# Patient Record
Sex: Female | Born: 1975
Health system: Southern US, Community
[De-identification: ages and names within clinical notes are randomized; demographics above are authoritative.]

## PROBLEM LIST (undated history)

## (undated) DIAGNOSIS — G4700 Insomnia, unspecified: Secondary | ICD-10-CM

## (undated) DIAGNOSIS — K219 Gastro-esophageal reflux disease without esophagitis: Secondary | ICD-10-CM

## (undated) DIAGNOSIS — Z9289 Personal history of other medical treatment: Secondary | ICD-10-CM

## (undated) DIAGNOSIS — E538 Deficiency of other specified B group vitamins: Secondary | ICD-10-CM

## (undated) DIAGNOSIS — J45909 Unspecified asthma, uncomplicated: Secondary | ICD-10-CM

## (undated) DIAGNOSIS — R937 Abnormal findings on diagnostic imaging of other parts of musculoskeletal system: Secondary | ICD-10-CM

## (undated) DIAGNOSIS — R319 Hematuria, unspecified: Secondary | ICD-10-CM

## (undated) DIAGNOSIS — F419 Anxiety disorder, unspecified: Secondary | ICD-10-CM

## (undated) DIAGNOSIS — G5600 Carpal tunnel syndrome, unspecified upper limb: Secondary | ICD-10-CM

## (undated) DIAGNOSIS — R0683 Snoring: Secondary | ICD-10-CM

## (undated) DIAGNOSIS — R928 Other abnormal and inconclusive findings on diagnostic imaging of breast: Secondary | ICD-10-CM

## (undated) DIAGNOSIS — T4145XA Adverse effect of unspecified anesthetic, initial encounter: Secondary | ICD-10-CM

## (undated) DIAGNOSIS — G4730 Sleep apnea, unspecified: Secondary | ICD-10-CM

## (undated) DIAGNOSIS — F410 Panic disorder [episodic paroxysmal anxiety] without agoraphobia: Secondary | ICD-10-CM

## (undated) DIAGNOSIS — G709 Myoneural disorder, unspecified: Secondary | ICD-10-CM

## (undated) DIAGNOSIS — R42 Dizziness and giddiness: Secondary | ICD-10-CM

## (undated) DIAGNOSIS — M797 Fibromyalgia: Secondary | ICD-10-CM

## (undated) DIAGNOSIS — E079 Disorder of thyroid, unspecified: Secondary | ICD-10-CM

## (undated) DIAGNOSIS — E559 Vitamin D deficiency, unspecified: Secondary | ICD-10-CM

## (undated) DIAGNOSIS — R569 Unspecified convulsions: Secondary | ICD-10-CM

## (undated) DIAGNOSIS — T8859XA Other complications of anesthesia, initial encounter: Secondary | ICD-10-CM

## (undated) HISTORY — DX: Unspecified convulsions: R56.9

## (undated) HISTORY — DX: Dizziness and giddiness: R42

## (undated) HISTORY — PX: APPENDECTOMY: SHX54

## (undated) HISTORY — DX: Snoring: R06.83

## (undated) HISTORY — DX: Hematuria, unspecified: R31.9

## (undated) HISTORY — DX: Deficiency of other specified B group vitamins: E53.8

## (undated) HISTORY — DX: Gastro-esophageal reflux disease without esophagitis: K21.9

## (undated) HISTORY — DX: Other abnormal and inconclusive findings on diagnostic imaging of breast: R92.8

## (undated) HISTORY — DX: Vitamin D deficiency, unspecified: E55.9

## (undated) HISTORY — DX: Personal history of other medical treatment: Z92.89

## (undated) HISTORY — DX: Hemochromatosis, unspecified: E83.119

## (undated) HISTORY — DX: Abnormal findings on diagnostic imaging of other parts of musculoskeletal system: R93.7

## (undated) HISTORY — DX: Insomnia, unspecified: G47.00

## (undated) HISTORY — DX: Unspecified asthma, uncomplicated: J45.909

## (undated) HISTORY — PX: HERNIA REPAIR: SHX51

## (undated) HISTORY — DX: Myoneural disorder, unspecified: G70.9

## (undated) HISTORY — DX: Fibromyalgia: M79.7

## (undated) HISTORY — DX: Disorder of thyroid, unspecified: E07.9

## (undated) HISTORY — DX: Carpal tunnel syndrome, unspecified upper limb: G56.00

---

## 2005-09-30 HISTORY — PX: DILATION AND CURETTAGE OF UTERUS: SHX78

## 2005-10-02 ENCOUNTER — Emergency Department (HOSPITAL_COMMUNITY): Admission: EM | Admit: 2005-10-02 | Discharge: 2005-10-02 | Payer: Self-pay | Admitting: Emergency Medicine

## 2005-12-09 ENCOUNTER — Emergency Department (HOSPITAL_COMMUNITY): Admission: EM | Admit: 2005-12-09 | Discharge: 2005-12-09 | Payer: Self-pay | Admitting: *Deleted

## 2008-03-06 ENCOUNTER — Emergency Department (HOSPITAL_BASED_OUTPATIENT_CLINIC_OR_DEPARTMENT_OTHER): Admission: EM | Admit: 2008-03-06 | Discharge: 2008-03-06 | Payer: Self-pay | Admitting: Emergency Medicine

## 2008-11-05 ENCOUNTER — Emergency Department (HOSPITAL_BASED_OUTPATIENT_CLINIC_OR_DEPARTMENT_OTHER): Admission: EM | Admit: 2008-11-05 | Discharge: 2008-11-05 | Payer: Self-pay | Admitting: Emergency Medicine

## 2008-11-16 ENCOUNTER — Encounter: Admission: RE | Admit: 2008-11-16 | Discharge: 2008-11-16 | Payer: Self-pay | Admitting: Family Medicine

## 2009-06-30 ENCOUNTER — Ambulatory Visit: Payer: Self-pay | Admitting: Hematology & Oncology

## 2009-07-20 LAB — CBC WITH DIFFERENTIAL (CANCER CENTER ONLY)
BASO#: 0 10*3/uL (ref 0.0–0.2)
BASO%: 0.4 % (ref 0.0–2.0)
EOS%: 1.3 % (ref 0.0–7.0)
HGB: 12.5 g/dL (ref 11.6–15.9)
LYMPH%: 35.2 % (ref 14.0–48.0)
MCH: 30 pg (ref 26.0–34.0)
MCV: 87 fL (ref 81–101)
MONO#: 0.3 10*3/uL (ref 0.1–0.9)
MONO%: 5.9 % (ref 0.0–13.0)
NEUT%: 57.2 % (ref 39.6–80.0)

## 2009-07-20 LAB — CHCC SATELLITE - SMEAR

## 2009-07-21 LAB — RETICULOCYTES (CHCC)
RBC.: 4.24 MIL/uL (ref 3.87–5.11)
Retic Ct Pct: 0.6 % (ref 0.4–3.1)

## 2009-07-21 LAB — LACTATE DEHYDROGENASE: LDH: 113 U/L (ref 94–250)

## 2009-07-21 LAB — ERYTHROPOIETIN: Erythropoietin: 12 m[IU]/mL (ref 2.6–34.0)

## 2009-07-24 LAB — INTRINSIC FACTOR ANTIBODIES: Intrinsic Factor: NEGATIVE

## 2009-07-31 ENCOUNTER — Ambulatory Visit: Payer: Self-pay | Admitting: Hematology & Oncology

## 2009-09-15 ENCOUNTER — Encounter: Admission: RE | Admit: 2009-09-15 | Discharge: 2009-09-15 | Payer: Self-pay | Admitting: Gastroenterology

## 2009-09-15 ENCOUNTER — Ambulatory Visit: Payer: Self-pay | Admitting: Hematology & Oncology

## 2009-09-18 LAB — CBC WITH DIFFERENTIAL (CANCER CENTER ONLY)
BASO#: 0 10*3/uL (ref 0.0–0.2)
HCT: 35 % (ref 34.8–46.6)
LYMPH#: 1.7 10*3/uL (ref 0.9–3.3)
MCHC: 35.1 g/dL (ref 32.0–36.0)
MCV: 86 fL (ref 81–101)
MONO%: 5.6 % (ref 0.0–13.0)
NEUT#: 3.1 10*3/uL (ref 1.5–6.5)
Platelets: 191 10*3/uL (ref 145–400)
RBC: 4.09 10*6/uL (ref 3.70–5.32)
RDW: 12.2 % (ref 10.5–14.6)

## 2009-09-18 LAB — RETICULOCYTES (CHCC)
ABS Retic: 45.4 10*3/uL (ref 19.0–186.0)
Retic Ct Pct: 1.1 % (ref 0.4–3.1)

## 2010-01-08 ENCOUNTER — Ambulatory Visit: Payer: Self-pay | Admitting: Interventional Radiology

## 2010-01-08 ENCOUNTER — Ambulatory Visit (HOSPITAL_BASED_OUTPATIENT_CLINIC_OR_DEPARTMENT_OTHER): Admission: RE | Admit: 2010-01-08 | Discharge: 2010-01-08 | Payer: Self-pay | Admitting: Family Medicine

## 2011-01-15 LAB — BASIC METABOLIC PANEL
Calcium: 9.2 mg/dL (ref 8.4–10.5)
Chloride: 103 mEq/L (ref 96–112)
Creatinine, Ser: 0.8 mg/dL (ref 0.4–1.2)
GFR calc Af Amer: >60 mL/min (ref >60)
Glucose, Bld: 103 mg/dL — ABNORMAL HIGH (ref 70–99)
Potassium: 3.9 mEq/L (ref 3.5–5.1)

## 2011-01-15 LAB — URINALYSIS, ROUTINE W REFLEX MICROSCOPIC
Glucose, UA: NEGATIVE mg/dL
Ketones, ur: NEGATIVE mg/dL
Leukocytes, UA: NEGATIVE
Protein, ur: NEGATIVE mg/dL
pH: 7 (ref 5.0–8.0)

## 2011-11-06 ENCOUNTER — Emergency Department (HOSPITAL_BASED_OUTPATIENT_CLINIC_OR_DEPARTMENT_OTHER)
Admission: EM | Admit: 2011-11-06 | Discharge: 2011-11-06 | Disposition: A | Discharge status: Home / Self Care | Payer: Managed Care, Other (non HMO) | Attending: Emergency Medicine | Admitting: Emergency Medicine

## 2011-11-06 ENCOUNTER — Encounter (HOSPITAL_BASED_OUTPATIENT_CLINIC_OR_DEPARTMENT_OTHER): Payer: Self-pay | Admitting: *Deleted

## 2011-11-06 DIAGNOSIS — F32A Depression, unspecified: Secondary | ICD-10-CM

## 2011-11-06 DIAGNOSIS — R110 Nausea: Secondary | ICD-10-CM

## 2011-11-06 DIAGNOSIS — F329 Major depressive disorder, single episode, unspecified: Secondary | ICD-10-CM | POA: Insufficient documentation

## 2011-11-06 DIAGNOSIS — F3289 Other specified depressive episodes: Secondary | ICD-10-CM | POA: Insufficient documentation

## 2011-11-06 HISTORY — DX: Panic disorder (episodic paroxysmal anxiety): F41.0

## 2011-11-06 HISTORY — DX: Anxiety disorder, unspecified: F41.9

## 2011-11-06 LAB — URINALYSIS, ROUTINE W REFLEX MICROSCOPIC
Bilirubin Urine: NEGATIVE
Glucose, UA: NEGATIVE mg/dL
Ketones, ur: NEGATIVE mg/dL
Leukocytes, UA: NEGATIVE
Nitrite: NEGATIVE
Protein, ur: NEGATIVE mg/dL
Specific Gravity, Urine: 1.004 — ABNORMAL LOW (ref 1.005–1.030)
Urobilinogen, UA: 0.2 mg/dL (ref 0.0–1.0)
pH: 6 (ref 5.0–8.0)

## 2011-11-06 LAB — URINE MICROSCOPIC-ADD ON

## 2011-11-06 LAB — PREGNANCY, URINE: Preg Test, Ur: NEGATIVE

## 2011-11-06 MED ORDER — SODIUM CHLORIDE 0.9 % IV BOLUS (SEPSIS)
1000.0000 mL | Freq: Once | INTRAVENOUS | Status: AC
  Administered 2011-11-06: 1000 mL via INTRAVENOUS

## 2011-11-06 MED ORDER — ONDANSETRON HCL 4 MG/2ML IJ SOLN
4.0000 mg | Freq: Once | INTRAMUSCULAR | Status: AC
  Administered 2011-11-06: 4 mg via INTRAVENOUS
  Filled 2011-11-06: qty 2

## 2011-11-06 MED ORDER — ONDANSETRON 8 MG PO TBDP: 8.0000 mg | ORAL_TABLET | Freq: Three times a day (TID) | ORAL | Status: AC | PRN

## 2011-11-06 MED ORDER — LORAZEPAM 1 MG PO TABS: 1.0000 mg | ORAL_TABLET | Freq: Every evening | ORAL | Status: AC | PRN

## 2011-11-06 MED ORDER — LORAZEPAM 2 MG/ML IJ SOLN
1.0000 mg | Freq: Once | INTRAMUSCULAR | Status: AC
  Administered 2011-11-06: 1 mg via INTRAVENOUS
  Filled 2011-11-06: qty 1

## 2011-11-06 NOTE — ED Provider Notes (Signed)
History     CSN: [id number]  Arrival date & time 11/06/11  0220   First MD Initiated Contact with Patient 11/06/11 0236      Chief Complaint  Patient presents with  . Nausea    (Consider location/radiation/quality/duration/timing/severity/associated sxs/prior treatment) HPI This is a 36 year old white female who had been taking care of her mother-in-law at her house. Her mother-in-law recently died. She has also become aware that her husband was unfaithful to her. She has become very depressed over the past several weeks. She has had difficulty sleeping and states she has not slept in several nights. She was prescribed amitriptyline 10 mg at bedtime but this has not helped. She has a therapist who has been treating her depression. She is here specifically for treatment of her insomnia and nausea. Because of her nausea she has not been eating or drinking for the past several days and feels like she is dehydrated. She feels very tired, which was exacerbated by an asthma attack and anxiety and hyperventilation earlier this morning. She states she feels very sad and weak but denies suicidal or homicidal ideation and does not wish any behavioral health intervention at this time. Symptoms are described as moderate to severe.  Past Medical History  Diagnosis Date  . Anxiety   . Panic attacks     History reviewed. No pertinent past surgical history.  History reviewed. No pertinent family history.  History  Substance Use Topics  . Smoking status: Smoker, Current Status Unknown  . Smokeless tobacco: Not on file  . Alcohol Use: Yes    OB History    Grav Para Term Preterm Abortions TAB SAB Ect Mult Living                  Review of Systems  All other systems reviewed and are negative.    Allergies  Review of patient's allergies indicates no known allergies.  Home Medications   Current Outpatient Rx  Name Route Sig Dispense Refill  . AMITRIPTYLINE HCL 10 MG PO TABS Oral Take 10  mg by mouth at bedtime.      BP 128/84  Pulse 95  Temp(Src) 98.2 F (36.8 C) (Oral)  Resp 18  Ht 5\' 5"  (1.651 m)  Wt 120 lb (54.432 kg)  BMI 19.97 kg/m2  SpO2 100%  Physical Exam General: Well-developed, well-nourished female in no acute distress; appearance consistent with age of record HENT: normocephalic, atraumatic Eyes: pupils equal round and reactive to light; extraocular muscles intact Neck: supple Heart: regular rate and rhythm Lungs: clear to auscultation bilaterally Abdomen: soft; nondistended; nontender; bowel sounds present Extremities: No deformity; full range of motion Neurologic: Awake, alert and oriented; motor function intact in all extremities and symmetric; no facial droop Skin: Warm and dry Psychiatric: Depressed with congruent affect    ED Course  Procedures (including critical care time)     MDM   Nursing notes and vitals signs, including pulse oximetry, reviewed.  Summary of this visit's results, reviewed by myself:  Labs:  Results for orders placed during the hospital encounter of 11/06/11  URINALYSIS, ROUTINE W REFLEX MICROSCOPIC      Component Value Range   Color, Urine YELLOW  YELLOW    APPearance CLEAR  CLEAR    Specific Gravity, Urine 1.004 (*) 1.005 - 1.030    pH 6.0  5.0 - 8.0    Glucose, UA NEGATIVE  NEGATIVE (mg/dL)   Hgb urine dipstick LARGE (*) NEGATIVE    Bilirubin Urine NEGATIVE  NEGATIVE    Ketones, ur NEGATIVE  NEGATIVE (mg/dL)   Protein, ur NEGATIVE  NEGATIVE (mg/dL)   Urobilinogen, UA 0.2  0.0 - 1.0 (mg/dL)   Nitrite NEGATIVE  NEGATIVE    Leukocytes, UA NEGATIVE  NEGATIVE   PREGNANCY, URINE      Component Value Range   Preg Test, Ur NEGATIVE  NEGATIVE   URINE MICROSCOPIC-ADD ON      Component Value Range   Squamous Epithelial / LPF RARE  RARE    WBC, UA 0-2  <3 (WBC/hpf)   RBC / HPF 0-2  <3 (RBC/hpf)   Bacteria, UA RARE  RARE             Hanley Seamen, MD 11/06/11 [phone]

## 2011-11-06 NOTE — ED Notes (Signed)
C/o being sad, loss of appetite, and nausea for few days.

## 2011-11-06 NOTE — ED Notes (Signed)
Pt resting with eyes closed, resps even and unlabored, NAD noted. Family remains at bs.

## 2011-11-06 NOTE — ED Notes (Signed)
Pt states that she has been dealing with recent infidelity of her husband and the loss of a loved one. Pt denies SI or HI, just c/o inability to sleep or rest for days.

## 2011-11-06 NOTE — ED Notes (Signed)
MD at bedside. 

## 2012-09-30 DIAGNOSIS — R928 Other abnormal and inconclusive findings on diagnostic imaging of breast: Secondary | ICD-10-CM

## 2012-09-30 HISTORY — PX: KNEE ARTHROSCOPY: SUR90

## 2012-09-30 HISTORY — PX: CHOLECYSTECTOMY: SHX55

## 2012-09-30 HISTORY — DX: Other abnormal and inconclusive findings on diagnostic imaging of breast: R92.8

## 2015-01-09 ENCOUNTER — Ambulatory Visit: Payer: Managed Care, Other (non HMO) | Admitting: Nurse Practitioner

## 2015-05-31 ENCOUNTER — Ambulatory Visit: Payer: Managed Care, Other (non HMO) | Admitting: Family Medicine

## 2015-06-01 ENCOUNTER — Ambulatory Visit (INDEPENDENT_AMBULATORY_CARE_PROVIDER_SITE_OTHER): Payer: Managed Care, Other (non HMO) | Admitting: Family Medicine

## 2015-06-01 ENCOUNTER — Encounter: Payer: Self-pay | Admitting: Family Medicine

## 2015-06-01 VITALS — BP 122/86 | HR 79 | Temp 98.6°F | Resp 18 | Ht 65.0 in | Wt 148.8 lb

## 2015-06-01 DIAGNOSIS — Z23 Encounter for immunization: Secondary | ICD-10-CM | POA: Diagnosis not present

## 2015-06-01 DIAGNOSIS — J4520 Mild intermittent asthma, uncomplicated: Secondary | ICD-10-CM | POA: Diagnosis not present

## 2015-06-01 DIAGNOSIS — Z7689 Persons encountering health services in other specified circumstances: Secondary | ICD-10-CM | POA: Insufficient documentation

## 2015-06-01 DIAGNOSIS — F411 Generalized anxiety disorder: Secondary | ICD-10-CM

## 2015-06-01 DIAGNOSIS — E042 Nontoxic multinodular goiter: Secondary | ICD-10-CM | POA: Diagnosis not present

## 2015-06-01 DIAGNOSIS — Z0000 Encounter for general adult medical examination without abnormal findings: Secondary | ICD-10-CM

## 2015-06-01 LAB — CBC WITH DIFFERENTIAL/PLATELET
BASOS ABS: 0 10*3/uL (ref 0.0–0.1)
Basophils Relative: 0.5 % (ref 0.0–3.0)
EOS PCT: 0.7 % (ref 0.0–5.0)
Eosinophils Absolute: 0 10*3/uL (ref 0.0–0.7)
HEMATOCRIT: 40.9 % (ref 36.0–46.0)
Hemoglobin: 14 g/dL (ref 12.0–15.0)
LYMPHS ABS: 2.1 10*3/uL (ref 0.7–4.0)
LYMPHS PCT: 30.7 % (ref 12.0–46.0)
MCHC: 34.3 g/dL (ref 30.0–36.0)
MCV: 87.9 fl (ref 78.0–100.0)
MONOS PCT: 4.9 % (ref 3.0–12.0)
Monocytes Absolute: 0.3 10*3/uL (ref 0.1–1.0)
NEUTROS ABS: 4.3 10*3/uL (ref 1.4–7.7)
Neutrophils Relative %: 63.2 % (ref 43.0–77.0)
PLATELETS: 287 10*3/uL (ref 150.0–400.0)
RBC: 4.65 Mil/uL (ref 3.87–5.11)
RDW: 12.9 % (ref 11.5–15.5)
WBC: 6.9 10*3/uL (ref 4.0–10.5)

## 2015-06-01 LAB — COMPLETE METABOLIC PANEL WITH GFR
ALT: 14 U/L (ref 6–29)
AST: 18 U/L (ref 10–30)
Albumin: 4.3 g/dL (ref 3.6–5.1)
Alkaline Phosphatase: 62 U/L (ref 33–115)
BILIRUBIN TOTAL: 0.4 mg/dL (ref 0.2–1.2)
BUN: 11 mg/dL (ref 7–25)
CALCIUM: 9 mg/dL (ref 8.6–10.2)
CHLORIDE: 104 mmol/L (ref 98–110)
CO2: 24 mmol/L (ref 20–31)
CREATININE: 0.86 mg/dL (ref 0.50–1.10)
GFR, EST NON AFRICAN AMERICAN: 86 mL/min (ref >=60)
Glucose, Bld: 100 mg/dL — ABNORMAL HIGH (ref 65–99)
Potassium: 4.3 mmol/L (ref 3.5–5.3)
Sodium: 136 mmol/L (ref 135–146)
TOTAL PROTEIN: 7.1 g/dL (ref 6.1–8.1)

## 2015-06-01 LAB — IBC PANEL
IRON: 125 ug/dL (ref 42–145)
SATURATION RATIOS: 28.3 % (ref 20.0–50.0)
TRANSFERRIN: 315 mg/dL (ref 212.0–360.0)

## 2015-06-01 LAB — FERRITIN: Ferritin: 43.1 ng/mL (ref 10.0–291.0)

## 2015-06-01 LAB — TSH: TSH: 0.89 u[IU]/mL (ref 0.35–4.50)

## 2015-06-01 MED ORDER — ALBUTEROL SULFATE (2.5 MG/3ML) 0.083% IN NEBU: 2.5000 mg | INHALATION_SOLUTION | Freq: Four times a day (QID) | RESPIRATORY_TRACT | Status: DC | PRN

## 2015-06-01 MED ORDER — ESCITALOPRAM OXALATE 10 MG PO TABS: 10.0000 mg | ORAL_TABLET | Freq: Every day | ORAL | Status: DC

## 2015-06-01 MED ORDER — LORAZEPAM 0.5 MG PO TABS: 0.5000 mg | ORAL_TABLET | Freq: Every day | ORAL | Status: DC

## 2015-06-01 NOTE — Progress Notes (Signed)
Subjective:    Patient ID: Erika Gardner, female    DOB: [birth year], 39 y.o.   MRN: [id number]  HPI  Seizures: Patient reports a history of seizures x2 with syncopal episodes. She is being evaluated by neurology, Dr. Lorain Childes. He is with [facility] per patient. He states her last seizure was June 2016, and she did not seek treatment because she was leaving for vacation. She reports neurology has CT scan to her head, and is ordering an MRI.   Thyroid nodules: Patient reports a history of thyroid nodules, in which she states she has multiple nodules. She reports an ultrasound was completed "many years ago "after she had noted symptoms of pain and discomfort in her anterior neck, near her thyroid. At that time she was found to have nodules per patient's report. Per EMR, patient had a soft tissue/head and neck ultrasound completed in April 2011 which resulted with a small focal nodule (<1cm) in the superior aspect of the right lobe. No other nodules were identified. He was recommended to be followed by ultrasound. Her last TSH was October 2015 and was normal.  Asthma: Patient reports a history of asthma as a young adult. She rarely has needed an albuterol inhaler, but does need intermittently especially when having an upper respiratory infection. She currently does not have a rescue inhaler at home.  Anxiety: Patient reports a history of generalized anxiety disorder. She states she is in counseling at [facility]. She is currently taking  Xanax, lexapro and amitriptyline. She states she does not desire to be on medications, but they try to stop her amitriptyline and as when she started having seizures. She does not desire to be on Xanax, cut she knows that can be addictive.  Hemochromatosis: Patient reports a history hemochromatosis. She reports she hasn't had any iron studies in some time, and she is overdue to have her blood checked. Patient does not have diabetes, denies any skin  pigment changes, increased fatigue/lethargy. Patient also has a history of fibromyalgia. Review of old records, it appears her last labs were October 2015. TIBC 372, U IBC 264, iron 108, iron saturation 29, ferritin 92, within normal limits.  Health maintenance:  Colonoscopy:No fhx, screening 51 Mammogram: 2014 for tenderness, no fhx.  Cervical cancer screening: Due this past August, no abnormal, as OB/GYN Immunizations: need flu and tdap Infectious disease screening: Unknown HIV screening, requesting records  Last labs by requested records 07/18/2014:  Vitamin D, 38.8 CMP:  Sodium 142, potassium 4.1, chloride 104, glucose 89, BUN 9, creatinine 0.8, calcium 9.4, AST 24, ALT 30, alkaline phosphate 81, bilirubin 0.2, total protein 6.9, albumen 3.9. TSH: 1.371, total T4 10.9, thyroid peroxidase antibodies 10 B 12: 713 Folate 16.5 CBC: WBC 8.3, RBC 4.31, hemoglobin 12.6, hematocrit 38.3, MCV 89, MCH 29.2 MCHC 32.9, RDW 12.3, platelet 256.   Past Medical History  Diagnosis Date  . Anxiety   . Panic attacks   . B12 deficiency   . Asthma   . GERD (gastroesophageal reflux disease)   . Neuromuscular disorder   . Thyroid disease   . Seizures     x2 with a syncopal episode (03/2015- last episode)  . Fibromyalgia   . Vertigo    No Known Allergies Past Surgical History  Procedure Laterality Date  . Cholecystectomy    . Dilation and curettage of uterus  2007    Post miscarriage  . Knee arthroscopy Right 2014    wear and tear  . Hernia repair  umbilicus   Family History  Problem Relation Age of Onset  . Arthritis Maternal Grandmother   . Hearing loss Maternal Grandmother   . Cancer Maternal Grandfather     skin  . Heart disease Maternal Grandfather   . Arthritis Paternal Grandmother   . Cancer Paternal Grandfather     kidney  . Heart disease Paternal Grandfather   . Kidney disease Paternal Grandfather    Social History   Social History  . Marital Status: Married     Spouse Name: N/A  . Number of Children: N/A  . Years of Education: N/A   Occupational History  . Not on file.   Social History Main Topics  . Smoking status: Never Smoker   . Smokeless tobacco: Never Used  . Alcohol Use: Yes  . Drug Use: No  . Sexual Activity: Yes   Other Topics Concern  . Not on file   Social History Narrative   Married. 3 children, stay at home mother. Father in law live with them as well. No alcohol, former smoker. No recreational drugs. Wears her seatbelt. Wears a bike helmet. Exercises at least 3 times a week. Smoke alarm in the home. There are firearms in the home.    Review of Systems  Constitutional: Positive for fatigue. Negative for fever, chills, activity change, appetite change and unexpected weight change.  HENT: Negative for congestion, nosebleeds, rhinorrhea, sinus pressure, sore throat, trouble swallowing and voice change.   Eyes: Negative for pain, redness and visual disturbance.  Respiratory: Negative for cough, chest tightness, shortness of breath and wheezing.   Cardiovascular: Negative for chest pain, palpitations and leg swelling.  Gastrointestinal: Negative for nausea, vomiting, diarrhea, constipation, blood in stool and abdominal distention.  Endocrine: Negative for polyuria.  Genitourinary: Negative for dysuria, urgency, frequency, difficulty urinating, vaginal pain, pelvic pain and dyspareunia.  Musculoskeletal: Positive for myalgias and arthralgias. Negative for back pain, joint swelling, gait problem and neck pain.  Skin: Negative.   Allergic/Immunologic: Negative.   Neurological: Positive for dizziness, seizures, syncope, weakness, light-headedness and headaches. Negative for facial asymmetry, speech difficulty and numbness.  Hematological: Negative.   Psychiatric/Behavioral: Negative for suicidal ideas, hallucinations, confusion, sleep disturbance and decreased concentration. The patient is nervous/anxious.        Objective:    Physical Exam BP 122/86 mmHg  Pulse 79  Temp(Src) 98.6 F (37 C) (Oral)  Resp 18  Ht  (1.651 m)  Wt 148 lb 12.8 oz (67.495 kg)  BMI 24.76 kg/m2  SpO2 100%  LMP 05/09/2015 Gen: Afebrile. No acute distress. Nontoxic in appearance, well-developed, well-nourished Caucasian female. Mild dizziness with laying flat (known vertigo). HENT: AT. [place]. MMM. Bilateral nares without erythema or swelling. Throat without erythema or exudates.  Eyes:Pupils Equal Round Reactive to light, Extraocular movements intact, Conjunctiva without redness, discharge or icterus. Neck: Supple,no lymphadenopathy, mild enlargement of right thyroid CV: RRR No murmur, no edema, +2/4 P posterior tibialis pulses Chest: CTAB, no wheeze or crackles Abd: Soft. Flat. NTND. BS present. No Masses palpated.  Skin: No rashes, purpura or petechiae.  Neuro:  Normal gait. PERLA. EOMi. Alert. Oriented. Cranial nerves II through XII intact. Muscle strength 5/5 upper and lower extremity. DTRs equal bilaterally. Psych: Anxious. Normal dress. Normal speech. Normal thought content and judgment. No SI or HI     Assessment & Plan:  1. Asthma, mild intermittent, uncomplicated - Patient has mild intermittent asthma, has been without an albuterol rescue inhaler. Inhaler prescribed today for emergencies. - albuterol (PROVENTIL) (  2.5 MG/3ML) 0.083% nebulizer solution; Take 3 mLs (2.5 mg total) by nebulization every 6 (six) hours as needed for wheezing or shortness of breath.  Dispense: 150 mL; Refill: 1  2. Generalized anxiety disorder - Patient priorly on Xanax, discontinue Xanax. Start Ativan 0.5 mg daily at bedtime. - LORazepam (ATIVAN) 0.5 MG tablet; Take 1 tablet (0.5 mg total) by mouth at bedtime.  Dispense: 30 tablet; Refill: 1  3. Visit for preventive health examination - Tdap given today, flu declined. - CBC w/Diff - COMPLETE METABOLIC PANEL WITH GFR  4. Multiple thyroid nodules--> [place] in system states 1 right sided nodule, pt  states multiple.   - Last [place] 2011--> it appears one was ordered in 2015, but no records of results.  - TSH - Recommend repeat US, since mild thyromegaly present on  My exam and review of records do not document palpable thyroid.   5. Hemochromatosis - IBC panel - Ferritin/transferrin  - Iron  6. Immunization due - Tdap vaccine greater than or equal to 7yo IM  Follow-up on anxiety in 2-3 months, sooner if needed. Follow-up on B 12 at that visit as well.

## 2015-06-01 NOTE — Patient Instructions (Signed)

## 2015-06-02 ENCOUNTER — Telehealth: Payer: Self-pay | Admitting: Family Medicine

## 2015-06-02 ENCOUNTER — Encounter: Payer: Self-pay | Admitting: Family Medicine

## 2015-06-02 NOTE — Telephone Encounter (Signed)
Patient aware. She will go to [facility] and get that done.  She will call them now to set up appt.

## 2015-06-02 NOTE — Telephone Encounter (Signed)
Please call patient: In review of her records we received from her prior PCP, I do not see where she's had a follow-up ultrasound on her neck since 2011. In 2011 a small right sided nodule was found, and on my exam yesterday I felt an increased fullness on the right side of her thyroid. I am recommending that we follow-up on a repeat ultrasound of her thyroid at her earliest convenience. The order has been placed, location is whatever is as convenient for her.

## 2015-06-02 NOTE — Telephone Encounter (Signed)
Patient aware of normal results

## 2015-06-02 NOTE — Telephone Encounter (Signed)
Pt scheduled [place] for 06/06/15.

## 2015-06-02 NOTE — Telephone Encounter (Signed)
Please call pt: Her labs thyroid/cells/iron panel all looked normal. Thanks.

## 2015-06-06 ENCOUNTER — Telehealth: Payer: Self-pay | Admitting: Family Medicine

## 2015-06-06 ENCOUNTER — Ambulatory Visit (HOSPITAL_BASED_OUTPATIENT_CLINIC_OR_DEPARTMENT_OTHER)
Admission: RE | Admit: 2015-06-06 | Discharge: 2015-06-06 | Disposition: A | Discharge status: Home / Self Care | Payer: Managed Care, Other (non HMO) | Source: Ambulatory Visit | Attending: Family Medicine | Admitting: Family Medicine

## 2015-06-06 ENCOUNTER — Other Ambulatory Visit: Payer: Self-pay | Admitting: Family Medicine

## 2015-06-06 DIAGNOSIS — E010 Iodine-deficiency related diffuse (endemic) goiter: Secondary | ICD-10-CM | POA: Diagnosis present

## 2015-06-06 DIAGNOSIS — E041 Nontoxic single thyroid nodule: Secondary | ICD-10-CM | POA: Insufficient documentation

## 2015-06-06 DIAGNOSIS — E042 Nontoxic multinodular goiter: Secondary | ICD-10-CM

## 2015-06-06 MED ORDER — ALBUTEROL SULFATE HFA 108 (90 BASE) MCG/ACT IN AERS: 2.0000 | INHALATION_SPRAY | Freq: Four times a day (QID) | RESPIRATORY_TRACT | Status: DC | PRN

## 2015-06-06 NOTE — Telephone Encounter (Signed)
LMOM for pt to CB regarding results.  

## 2015-06-06 NOTE — Telephone Encounter (Addendum)
Pt called back and stated that she did have U/S 07/20/2014 and thyroid nodule at that time was 0.9x 0.5x 0.7 in upper right lobe.  Pt just wanted you to be aware since you didn't see this in her records.   Ordered by Dr. Garner Nash.

## 2015-06-06 NOTE — Assessment & Plan Note (Signed)
Enlarging on ultrasound September 6. At 0.9 mm, recommended follow-up in 12 months with ultrasound.

## 2015-06-06 NOTE — Telephone Encounter (Signed)
Patient aware of results.   No other questions about test results at this time.   Patient states that she was supposed to get a rescue inhaler RX but when she picked up Rx it was for nebulizer solution.  Okay to fill inhaler?  Please advise.

## 2015-06-06 NOTE — Telephone Encounter (Signed)
Refilled inhaler

## 2015-06-06 NOTE — Telephone Encounter (Signed)
Please call patient, her neck ultrasound showed mild enlarging of the nodule. It is not considered large enough to biopsy at this time, it is recommended that we follow up with a repeat ultrasound in 12 months.

## 2015-07-13 ENCOUNTER — Telehealth: Payer: Self-pay | Admitting: Family Medicine

## 2015-07-13 NOTE — Telephone Encounter (Signed)
If she is feeling fatigued she needs to come in for evaluation and will discuss and perform labs if needed.

## 2015-07-13 NOTE — Telephone Encounter (Signed)
Patient would like to have her vitamin D & B12 checked. She has been a little tired lately.

## 2015-07-13 NOTE — Telephone Encounter (Signed)
Please advise.  I tried to look back in your notes and it looks like she may have had these recently at a different office.

## 2015-07-13 NOTE — Telephone Encounter (Signed)
Pt aware.   Appt scheduled.  

## 2015-07-14 ENCOUNTER — Ambulatory Visit (INDEPENDENT_AMBULATORY_CARE_PROVIDER_SITE_OTHER): Payer: Managed Care, Other (non HMO) | Admitting: Family Medicine

## 2015-07-14 ENCOUNTER — Encounter: Payer: Self-pay | Admitting: Family Medicine

## 2015-07-14 ENCOUNTER — Telehealth: Payer: Self-pay | Admitting: Family Medicine

## 2015-07-14 VITALS — BP 116/81 | HR 68 | Temp 98.8°F | Resp 16 | Ht 65.0 in | Wt 157.0 lb

## 2015-07-14 DIAGNOSIS — R5382 Chronic fatigue, unspecified: Secondary | ICD-10-CM | POA: Diagnosis not present

## 2015-07-14 DIAGNOSIS — L049 Acute lymphadenitis, unspecified: Secondary | ICD-10-CM

## 2015-07-14 DIAGNOSIS — R5383 Other fatigue: Secondary | ICD-10-CM | POA: Insufficient documentation

## 2015-07-14 DIAGNOSIS — E559 Vitamin D deficiency, unspecified: Secondary | ICD-10-CM | POA: Insufficient documentation

## 2015-07-14 LAB — VITAMIN B12: Vitamin B-12: 528 pg/mL (ref 211–911)

## 2015-07-14 LAB — VITAMIN D 25 HYDROXY (VIT D DEFICIENCY, FRACTURES): VITD: 32.53 ng/mL (ref 30.00–100.00)

## 2015-07-14 MED ORDER — VITAMIN D (ERGOCALCIFEROL) 1.25 MG (50000 UNIT) PO CAPS: 50000.0000 [IU] | ORAL_CAPSULE | ORAL | Status: DC

## 2015-07-14 MED ORDER — AMOXICILLIN-POT CLAVULANATE 875-125 MG PO TABS
1.0000 | ORAL_TABLET | Freq: Two times a day (BID) | ORAL | Status: DC
Start: 2015-07-14 — End: 2015-08-10

## 2015-07-14 NOTE — Patient Instructions (Signed)
I will call you with lab results. Think about treatment for Seasonal affective disorder if you do not feel better after starting vitamin supplement.   Seasonal Affective Disorder Seasonal affective disorder (SAD) is a form of depression. It is when you feel sad, down, or blue at specific times of the year. The most common time of year for this is late fall and winter, when the days are shorter and most people spend less time outdoors. This is why SAD is also known as the "winter blues." SAD occurs less commonly in the spring or summer.  SAD can vary in severity and can interfere with work, school, relationships, and normal daily activities. RISK FACTORS This condition is more common in:  Young women.  People who have a history of clinical depression or bipolar disorder.  People who live far [place] or far [place] of the equator. SYMPTOMS Symptoms of this condition include:  Depressed mood, such as:  Feeling sad, down, blue, or teary.  Having crying spells.  Irritability.  Trouble sleeping or sleeping more than usual.  Loss of interest in activities that you usually enjoy.  Feelings of guilt or worthlessness.  Restlessness or loss of energy.  Difficulty concentrating, remembering, or making decisions.  Significant change in appetite or weight.  Recurrent wishes for death, recurrent thoughts of self-harm, or an attempt at suicide. DIAGNOSIS Diagnosis of this condition is usually made through an assessment by your health care provider. You will be asked about your moods, thoughts, and behaviors. You will also be asked about your medical history, any major life changes, medicines, and substance use. A physical exam and lab work may be done to make sure there is no other cause for your depression. You may be referred to a mental health specialist for further evaluation.  TREATMENT Treatment for this condition may include:  Light therapy. Light therapy involves sitting in front of a  light source for 15-30 minutes every day. It is thought to work by increasing the duration of daylight that is sensed by the brain.  Antidepressant medicine.  Cognitive behavioral therapy (CBT).CBT is a form of talk therapy that helps to identify and change negative thoughts that are associated with SAD. HOME CARE INSTRUCTIONS  Take over-the-counter and prescription medicines only as told by your healthcare provider. This is important.  Check with your health care provider before you start taking any new prescriptions, over-the-counter medicines, herbs, or supplements.  Keep all follow-up visits as told by your health care provider.This is important.  Maintain a healthy lifestyle.  Eat healthy foods.  Get plenty of sleep.  Exercise regularly.  Do not drink alcohol.  Do not use recreational drugs.  Make your home and work environment as sunny or bright as possible. Open blinds and spend as much time as possible outside. SEEK MEDICAL CARE IF:  Your medicines do not seem to be helping.  Your symptoms do not improve or they get worse.  You have trouble taking care of yourself.  You experience side effects of medicines, such as changes in sleep patterns, dizziness, drowsiness, weight gain, restlessness, movement changes, or tremors. SEEK IMMEDIATE MEDICAL CARE IF:  You have serious thoughts about hurting yourself or others.  You experience serious side effects of medicine, such as:  Swelling of your face, lips, tongue, or throat.  Fever, confusion, muscle spasms, or seizures.   This information is not intended to replace advice given to you by your health care provider. Make sure you discuss any questions you  have with your health care provider.   Document Released: 06/11/2001 Document Revised: 01/31/2015 Document Reviewed: 09/20/2014 Elsevier Interactive Patient Education [employer]2016 Elsevier Inc.

## 2015-07-14 NOTE — Telephone Encounter (Signed)
Pt aware of results.  She will begin Vitamin D and B12 supplement.

## 2015-07-14 NOTE — Progress Notes (Signed)
Pre visit review using our clinic review tool, if applicable. No additional management support is needed unless otherwise documented below in the visit note. 

## 2015-07-14 NOTE — Progress Notes (Signed)
Subjective:    Patient ID: Erika Gardner, female    DOB: 1976-08-17, 39 y.o.   MRN: [id number]  HPI  Fatigue: Patient states she has chronic fatigue, that increases doing around this time every year. Last vitamin D in 2015 was 32. She does not supplement vitamin D currently. She has also had low B-12 in the past, and states she feels decreased energy whenever her B-12 is below 700. Patient is on Lexapro 10 mg, amitriptyline 20 mg which is being tapered because at higher doses she felt like she had hormonal changes that she can handle. She is trying to taper off the amitriptyline. She is on amitriptyline through her neurologist for her trigeminal neuralgia. Patient had labs drawn last month with a normal CMP, normal iron panel, normal CBC, normal TSH.   Tender lymph node: Patient reports she has her right lymph node just in front of her ear and below her jawline that has become increasingly tender over 2 weeks. She states the node has been there for approximately 9 months to a year, has been tender in the past. She does not feel that the lymph node is becoming larger. Patient is being worked through neurology and ENT for vertigo, ringing in the ears, ear fullness and trigeminal neuralgia. She sees Dr. Marcello Moores in [place].  Past Medical History  Diagnosis Date  . Anxiety   . Panic attacks   . B12 deficiency   . Asthma   . GERD (gastroesophageal reflux disease)   . Neuromuscular disorder (HCC)   . Thyroid disease   . Seizures (HCC)     x2 with a syncopal episode (03/2015- last episode)  . Fibromyalgia   . Vertigo    No Known Allergies Past Surgical History  Procedure Laterality Date  . Cholecystectomy    . Dilation and curettage of uterus  2007    Post miscarriage  . Knee arthroscopy Right 2014    wear and tear  . Hernia repair      umbilicus   Social History   Social History  . Marital Status: Married    Spouse Name: N/A  . Number of Children: N/A  . Years of Education:  N/A   Occupational History  . Not on file.   Social History Main Topics  . Smoking status: Never Smoker   . Smokeless tobacco: Never Used  . Alcohol Use: Yes  . Drug Use: No  . Sexual Activity: Yes   Other Topics Concern  . Not on file   Social History Narrative   Married. 3 children, stay at home mother. Father in law live with them as well. No alcohol, former smoker. No recreational drugs. Wears her seatbelt. Wears a bike helmet. Exercises at least 3 times a week. Smoke alarm in the home. There are firearms in the home.     Review of Systems Negative, with the exception of above mentioned in HPI     Objective:   Physical Exam BP 116/81 mmHg  Pulse 68  Temp(Src) 98.8 F (37.1 C) (Temporal)  Resp 16  Ht  (1.651 m)  Wt 157 lb (71.215 kg)  BMI 26.13 kg/m2  SpO2 98% Gen: Afebrile. No acute distress. Nontoxic in appearance, well-developed, well-nourished, Caucasian female. HENT: AT. [name]. Bilateral TM visualized and normal in appearance. MMM. Bilateral nares without erythema or swelling. Throat without erythema or exudates. No tenderness to palpation maxillary or frontal sinuses. Eyes:Pupils Equal Round Reactive to light, Extraocular movements intact,  Conjunctiva without redness, discharge  or icterus. Neck/lymp/endocrine: Supple, small pea-sized anterior cervical lymph node just below and posterior to right jawline. Mild tenderness to palpation over lymph node.  CV: RRR  Chest: CTAB, no wheeze or crackles Abd: Soft. Flat. NTND. BS present. No Masses palpated.  Skin: No rashes, purpura or petechiae.  Neuro:  Normal gait. PERLA. EOMi. Alert. Orientedx3, no focal deficits appreciated.  Psych: Normal dress, appears mildly withdrawn versus fatigued. Mildly slowed speech. Normal thought content and judgment..      Assessment & Plan:  1. Chronic fatigue - We'll recheck vitamin D and B 12 levels today, if needed will supplement appropriately. - Discussed with patient to  consider seasonal affective disorder, she does not see improvement with supplementation. This seems to be occurring for her around this time every year and through the winter months. Patient is currently on Lexapro, may need additional medication or higher dose. - Vitamin D (25 hydroxy) - B12  2. Acute lymphadenitis - Unknown etiology of right cervical anterior lymph node. Patient has been to see ENT and neurology for what is presumed to be right-sided trigeminal neuralgia. Uncertain if lymph node is playing a role, or secondary to chronic infection. We will attempt to treat with antibiotic round. Patient encouraged to follow-up if this does not improve her symptoms. - amoxicillin-clavulanate (AUGMENTIN) 875-125 MG tablet; Take 1 tablet by mouth 2 (two) times daily.  Dispense: 20 tablet; Refill: 0

## 2015-07-14 NOTE — Telephone Encounter (Signed)
Please call pt her vitamin D is 32.5, her B-12 is 528. I have called 50,000 units of vitamin D to take them once a week for 12 weeks, then we will recheck her vitamin D. With a B-12 of 528, if she feels necessary she can take over-the-counter supplementation daily of her vitamin B-12.

## 2015-07-19 ENCOUNTER — Encounter: Payer: Self-pay | Admitting: Family Medicine

## 2015-08-01 ENCOUNTER — Ambulatory Visit: Payer: Managed Care, Other (non HMO) | Admitting: Family Medicine

## 2015-08-03 ENCOUNTER — Telehealth: Payer: Self-pay | Admitting: *Deleted

## 2015-08-03 ENCOUNTER — Ambulatory Visit: Payer: Managed Care, Other (non HMO) | Admitting: Family Medicine

## 2015-08-03 DIAGNOSIS — F411 Generalized anxiety disorder: Secondary | ICD-10-CM

## 2015-08-03 NOTE — Telephone Encounter (Signed)
Patient called and left message on voice mail she states she had to miss her appt today because her daughter was home sick. She is requesting a 1 week supply of her Ativan until she can be seen next week. Please advise.

## 2015-08-04 MED ORDER — LORAZEPAM 0.5 MG PO TABS: 0.5000 mg | ORAL_TABLET | Freq: Every day | ORAL | Status: DC

## 2015-08-04 NOTE — Telephone Encounter (Signed)
Patient left message stating she was out of town today and wants Ativan called in to [facility] [place]. Pharmacy phone number [phone]. Patient can be reached at [phone].

## 2015-08-04 NOTE — Telephone Encounter (Signed)
Refilled x1 month, please fax to the pharmacy she requested. Must have appt for future refills.

## 2015-08-08 ENCOUNTER — Encounter: Payer: Self-pay | Admitting: Family Medicine

## 2015-08-08 DIAGNOSIS — R928 Other abnormal and inconclusive findings on diagnostic imaging of breast: Secondary | ICD-10-CM | POA: Insufficient documentation

## 2015-08-10 ENCOUNTER — Ambulatory Visit (INDEPENDENT_AMBULATORY_CARE_PROVIDER_SITE_OTHER): Payer: Managed Care, Other (non HMO) | Admitting: Family Medicine

## 2015-08-10 ENCOUNTER — Encounter: Payer: Self-pay | Admitting: Family Medicine

## 2015-08-10 VITALS — BP 108/76 | HR 78 | Temp 98.5°F | Resp 20 | Ht 65.0 in | Wt 159.2 lb

## 2015-08-10 DIAGNOSIS — R5382 Chronic fatigue, unspecified: Secondary | ICD-10-CM

## 2015-08-10 DIAGNOSIS — E559 Vitamin D deficiency, unspecified: Secondary | ICD-10-CM | POA: Diagnosis not present

## 2015-08-10 DIAGNOSIS — F411 Generalized anxiety disorder: Secondary | ICD-10-CM | POA: Diagnosis not present

## 2015-08-10 MED ORDER — LORAZEPAM 0.5 MG PO TABS: 0.5000 mg | ORAL_TABLET | Freq: Every day | ORAL | Status: DC

## 2015-08-10 MED ORDER — OMEPRAZOLE 20 MG PO CPDR: 20.0000 mg | DELAYED_RELEASE_CAPSULE | Freq: Every day | ORAL | Status: DC

## 2015-08-10 NOTE — Progress Notes (Signed)
Subjective:    Patient ID: Erika DenseRegina Gardner, female    DOB: 28-Feb-1976, 39 y.o.   MRN: [id number]  HPI Patient presents for follow-up on fatigue and anxiety today.  Fatigue: Patient states that she has completed her vitamin D supplementation, she has incorrectly taking the vitamin D which is to be once a week for over 12 weeks. Patient does state she has improvement in her fatigue, but is still experiencing some fatigue. She has restarted her B-12 supplementations as well, she does not desire to continue the injections since there is a co-pay with each injection, even if she has them at the pharmacy. She states that she normally does not absorb regular dose be 12. Patient hasn't joined an exercise program as of yet secondary to her chronic neck pain. She states she is having epidural injections placed this Friday for her pain management clinic. Patient has stopped her amitriptyline.  Anxiety: Patient states that she has stopped her amitriptyline. She is using the Ativan 0.5 mg nightly, and continues to take Lexapro 10 mg daily. She eventually would like to wean off these medications, as she feels like she only needed these secondary to a stressful time of like she was going through. Of note patient did call in to have her prescription refilled for the Ativan last week, and stated she could not go without. Patient is wanting to wean off of Ativan and Lexapro eventually.   Of note at the end of appointment, patient mentioned her lymph node to the right side of her mandible is still tender. She denies any swelling or erythema. She had been seen by an ENT in [place] in June. Patient states it better or worse. Briefly discussed that if patient feels it is getting worse, the left and is larger or more tender, we would want to possibly reimage area versus sending her to local ENT. Until that time. She can get back in contact with her prior ENT for follow-up.  Past Medical History  Diagnosis Date  .  Anxiety   . Panic attacks   . B12 deficiency   . Asthma   . GERD (gastroesophageal reflux disease)   . Neuromuscular disorder (HCC)   . Thyroid disease     multinodular goiter  . Seizures (HCC)     x2 with a syncopal episode (03/2015- last episode)  . Fibromyalgia   . Vertigo   . Vitamin D deficiency   . Insomnia   . Snoring   . Carpal tunnel syndrome   . Abnormal mammogram of right breast 09/2012    stable hypoechoic lesion   Allergies  Allergen Reactions  . Hydromorphone Hcl Itching  . Savella [Milnacipran Hcl]     mood     Review of Systems Negative, with the exception of above mentioned in HPI     Objective:   Physical Exam BP 108/76 mmHg  Pulse 78  Temp(Src) 98.5 F (36.9 C) (Oral)  Resp 20  Ht 5\' 5"  (1.651 m)  Wt 159 lb 4 oz (72.235 kg)  BMI 26.50 kg/m2  SpO2 96%  LMP 08/04/2015 Gen: Afebrile. No acute distress. Nontoxic in appearance, well-developed, well-nourished, Caucasian female. Appears well. HENT: AT. [place].MMM.  Eyes:Pupils Equal Round Reactive to light, Extraocular movements intact,  Conjunctiva without redness, discharge or icterus. Neuro: Normal gait. PERLA. EOMi. Alert. Oriented x3  Psych: Normal affect, dress and demeanor. Normal speech. Normal thought content and judgment..     Assessment & Plan:  1. Generalized anxiety disorder - Patient  encouraged to continue Lexapro 10 mg daily. She will attempt to wean off Ativan over the next few months. Encouraged her not to go to to quickly, and she feels like she needs the Ativan that she should continue it. Would not encouraged her to decrease all medications at the same time, and she has just recently is continued her amitriptyline. - LORazepam (ATIVAN) 0.5 MG tablet; Take 1 tablet (0.5 mg total) by mouth at bedtime.  Dispense: 30 tablet; Refill: 1  2. Chronic fatigue/Vitamin D deficiency - Vitamin D (25 hydroxy); Future - Patient is to continue over-the-counter supplementation of 800 international units  daily. - She is to attempt to exercise at least 3 times a week, letting pain be her guide.  3 GERD:  - refills on PPI today. Changed to 20 mg Prilosec (was 40 mg)  4 months follow-up for anxiety, sooner if needed  > 25 minutes spent with patient, >50% of time spent face to face counseling patient and coordinating care.

## 2015-08-10 NOTE — Patient Instructions (Signed)
Pleasure to see you today. Take OTC vitamin D 600-800 daily, have levels rechecked by lab appt in 6 weeks Take 1000 mcg B12 daily oral or every other day 4 month f/u for anxiety, unless needed sooner

## 2015-09-05 ENCOUNTER — Encounter: Payer: Self-pay | Admitting: Family Medicine

## 2015-09-05 ENCOUNTER — Ambulatory Visit (INDEPENDENT_AMBULATORY_CARE_PROVIDER_SITE_OTHER): Payer: Managed Care, Other (non HMO) | Admitting: Family Medicine

## 2015-09-05 VITALS — BP 114/82 | HR 82 | Temp 98.2°F | Resp 20 | Wt 158.2 lb

## 2015-09-05 DIAGNOSIS — M25551 Pain in right hip: Secondary | ICD-10-CM | POA: Diagnosis not present

## 2015-09-05 NOTE — Patient Instructions (Signed)
I feel you may have an issue with or surrounding the inguinal ligament and the innervation to that area and muscles. I will need to order an imagining study to that area to investigate further. We will be in contact with you concerning setting appt for imagining once it is approved by your inusrance

## 2015-09-05 NOTE — Progress Notes (Signed)
Subjective:    Patient ID: Erika Gardner, female    DOB: 1975-11-09, 39 y.o.   MRN: [id number]  HPI   Right pelvic pain/thigh pain/ intermittent sciatica: Patient presents for an acute office visit with complaints of right pelvic pain that started November 13, 3 weeks ago. She was seen in urgent care at that time, and they had concerns for right inguinal hernia. Patient reports no trauma, no heavy lifting, no increase in activity. She has denied any urinary or bowel movement difficulties. She has known degenerative disc disease L5/S1 with sciatica. She reports she was on her menstrual cycle the week prior. She has a known history of ovarian cyst. Patient was referred to general surgery, which completed a CT scan that was normal with the exception of minimal pelvic fluid. She states general surgery did not feel it was her appendix or inguinal hernia, but they had concerns for a femoral hernia. She was set up to have a diagnostic laparoscopic procedure a week ago, and canceled because she wanted to have an MRI prior. She states that she spoke to her father who is a [employer], and stated that it could be a femoral nerve issue and she should have an MRI first. She reports the pain in her pelvis is the same as it was 3 weeks ago, it is a dull aching, with occasional sharp shooting pain. She states mostly the pain radiates along her inguinal ligament (patient points to this area), but sometimes she feels a shooting pain down to her inner thigh. She denies hip joint popping, catching or clicking. She states the pain is worse with sitting and better when she is not moving. She denies fever or chills. She was prescribed tramadol for her pain, but she states she is unable to take ask that makes her anxious. She has difficulty taking NSAIDs secondary to her stomach pain. She has had intermittent right tonsillar/parotid swelling of her lymph nodes over the past 6 months.  CBC WNL at urgent care per EMR, care  everywhere.  Patient's last menstrual period was 09/02/2015.   CT ABd/Pelvis 08/17/2015 at [facility]: IMPRESSION: No acute abnormality identified.   Result Narrative  TECHNIQUE: Axial CT abdomen and pelvis after 80cc Isovue-370 contrast. Radiation dose reduction was utilized (automated exposure control, mA or kV adjustment based on patient size, or iterative image reconstruction). COMPARISON:  11/18/14 INDICATION: R10.31: Right lower quadrant pain  FINDINGS:  CHEST: Lung bases clear.   ABDOMEN AND PELVIS: Liver: Within normal limits. Gallbladder and Bile ducts: Cholecystectomy. Pancreas: Within normal limits. Spleen: Within normal limits. Adrenals: Within normal limits.  GI tract: Within normal limits. Appendix: Within normal limits.  Kidneys: Within normal limits.  Ureters: Within normal limits. Urinary bladder: Within normal limits. Reproductive: Within normal limits.  Vascular: Within normal limits.  Peritoneum/Extraperitoneum: Trace pelvic fluid. Small nonspecific lymph nodes in the ileocolic mesentery.  Musculoskeletal: No acute osseous abnormality.    Never smoker  Past Medical History  Diagnosis Date  . Anxiety   . Panic attacks   . B12 deficiency   . Asthma   . GERD (gastroesophageal reflux disease)   . Neuromuscular disorder (HCC)   . Thyroid disease     multinodular goiter  . Seizures (HCC)     x2 with a syncopal episode (03/2015- last episode)  . Fibromyalgia   . Vertigo   . Vitamin D deficiency   . Insomnia   . Snoring   . Carpal tunnel syndrome   . Abnormal mammogram  of right breast 09/2012    stable hypoechoic lesion   Allergies  Allergen Reactions  . Hydromorphone Hcl Itching  . Savella [Milnacipran Hcl]     mood   Review of Systems Negative, with the exception of above mentioned in HPI     Objective:   Physical Exam BP 114/82 mmHg  Pulse 82  Temp(Src) 98.2 F (36.8 C) (Oral)  Resp 20  Wt 158 lb 4 oz (71.782 kg)  SpO2  99%  LMP 09/02/2015 Gen: Afebrile. No acute distress. Nontoxic in appearance, well-developed, well-nourished, Caucasian female. Abd: Soft. Flat. ND. Tenderness to deep palpation right pelvis, just superior to right inguinal ligament. BS present. No Masses palpated. No rebounding, no guarding. MSK: No erythema, very mild soft tissue swelling above inguinal ligament right pelvis, tender to deep palpation in area of swelling, reproduction of discomfort with resisted extension/flexion of hip. Full range of motion of right hip. No tenderness with internal or external rotation, negative psoas and obturator sign. negative straight leg raises bilaterally. Negative FABRE bilaterally. Negative FADDIR. equal femoral pulses bilaterally. neurovascularly intact distally. Neuro: Normal gait. Easily mobile on exam table. PERLA. EOMi. Alert. Oriented x3. Cranial nerves II through XII intact.     Assessment & Plan:  1. Pain, joint, pelvic region or thigh, right - Uncertain etiology of pain today. Pt is not desiring laparoscopic evaluation of abd/pelvic pain prior to MRI. She would like to avoid surgery if able. Discussed hernia dx in female sometimes are difficult to diagnose and need laparoscopic eval.  - Doubtful this is a femoral hernia as patient is concerned, discomfort and swelling is above or including the inguinal ligament and seems to radiate the course of ligament. She does complain of inner thigh radiation as well at times, not in the pattern of femoral cutaneous distribution, possibly obturator nerve. making ddx: L1 nerve injury, femoral hernia, inguinal hernia, pelvic congestion (although exacerbated by MSK movement), obturator nerve injury (L2-4), mesenteric lymphadenitis (CT with small nonspecific lymph nodes in the ileocolic mesentery).   - Prior CT report at [facility] included in Note.  - MRI pelvis and lumbar will be ordered today, considering continued pelvic pain with radiation of pain to inner thigh. -  F/U after MRI if results abnormal .

## 2015-09-21 ENCOUNTER — Other Ambulatory Visit: Payer: Managed Care, Other (non HMO)

## 2015-09-22 ENCOUNTER — Ambulatory Visit
Admission: RE | Admit: 2015-09-22 | Discharge: 2015-09-22 | Disposition: A | Discharge status: Home / Self Care | Payer: Managed Care, Other (non HMO) | Source: Ambulatory Visit | Attending: Family Medicine | Admitting: Family Medicine

## 2015-09-22 DIAGNOSIS — M25551 Pain in right hip: Secondary | ICD-10-CM

## 2015-09-22 MED ORDER — GADOBENATE DIMEGLUMINE 529 MG/ML IV SOLN
15.0000 mL | Freq: Once | INTRAVENOUS | Status: AC | PRN
  Administered 2015-09-22: 15 mL via INTRAVENOUS

## 2015-09-26 ENCOUNTER — Other Ambulatory Visit: Payer: Managed Care, Other (non HMO)

## 2015-09-26 ENCOUNTER — Telehealth: Payer: Self-pay | Admitting: Family Medicine

## 2015-09-26 ENCOUNTER — Other Ambulatory Visit (INDEPENDENT_AMBULATORY_CARE_PROVIDER_SITE_OTHER): Payer: Managed Care, Other (non HMO)

## 2015-09-26 DIAGNOSIS — E538 Deficiency of other specified B group vitamins: Secondary | ICD-10-CM

## 2015-09-26 DIAGNOSIS — E559 Vitamin D deficiency, unspecified: Secondary | ICD-10-CM | POA: Diagnosis not present

## 2015-09-26 LAB — VITAMIN B12: VITAMIN B 12: 332 pg/mL (ref 211–911)

## 2015-09-26 LAB — VITAMIN D 25 HYDROXY (VIT D DEFICIENCY, FRACTURES): VITD: 36.83 ng/mL (ref 30.00–100.00)

## 2015-09-26 NOTE — Telephone Encounter (Signed)
I believe patient told me that she wasn't taking injections anymore which is why she wanted it rechecked?

## 2015-09-26 NOTE — Telephone Encounter (Signed)
Patient came into office today for Vit D levels.  Pt requesting repeat B12 levels be drawn as well.  Please advise.   Patient also requesting results of MRI done Friday.

## 2015-09-26 NOTE — Telephone Encounter (Signed)
Pt requesting B12 re-check. Pt continues B12 weekly injections. Last level 2 months ago normal. Will check one additional time since going to weekly injections, if normal, will not need re-testing.  - MRI results will be given to pt once provider reviews.

## 2015-09-27 ENCOUNTER — Telehealth: Payer: Self-pay | Admitting: Family Medicine

## 2015-09-27 DIAGNOSIS — E559 Vitamin D deficiency, unspecified: Secondary | ICD-10-CM

## 2015-09-27 DIAGNOSIS — R591 Generalized enlarged lymph nodes: Secondary | ICD-10-CM | POA: Insufficient documentation

## 2015-09-27 MED ORDER — CHOLECALCIFEROL 75 MCG (3000 UT) PO TABS: 1.0000 | ORAL_TABLET | Freq: Every day | ORAL | Status: DC

## 2015-09-27 MED ORDER — VITAMIN B-12 1000 MCG PO TABS: 1000.0000 ug | ORAL_TABLET | Freq: Every day | ORAL | Status: AC

## 2015-09-27 NOTE — Telephone Encounter (Addendum)
Spoke with pt today noting labs and imaging results. Patient's B-12 and vitamin D on the lower end of normal, will call in daily supplementation of both, oral supplementation. She has been tried on high-dose once a week of vitamin D and B 12 injections, she will need daily supplementation in order to keep her levels normal. She will need to be retested in 8 weeks, she is unable to absorb the oral B-12, she will need weekly injections. Patient admits to not taking supplementations daily as prescribed, and was taking intermittently. Discussed her MRI results and the right groin pain was likely coming from a reactive lymph node 1 cm in the right inguinal area. There was fat stranding around this lymph node. Discussed with patient would be prudent to move forward with more of an investigation on her now generalized lymphadenopathy considering she's had right submandibular lymph node that becomes inflamed periodically and her right posterior cervical lymph node in the past. Labs will be placed, and patient will come and have labs drawn 2-3 days before she makes her provider appointment to discuss. Pt aware and in understanding of plan.

## 2015-09-28 ENCOUNTER — Other Ambulatory Visit: Payer: Self-pay | Admitting: Family Medicine

## 2015-09-28 ENCOUNTER — Other Ambulatory Visit (INDEPENDENT_AMBULATORY_CARE_PROVIDER_SITE_OTHER): Payer: Managed Care, Other (non HMO)

## 2015-09-28 DIAGNOSIS — E559 Vitamin D deficiency, unspecified: Secondary | ICD-10-CM

## 2015-09-28 DIAGNOSIS — R591 Generalized enlarged lymph nodes: Secondary | ICD-10-CM

## 2015-09-28 LAB — CBC WITH DIFFERENTIAL/PLATELET
BASOS PCT: 0.4 % (ref 0.0–3.0)
Basophils Absolute: 0 10*3/uL (ref 0.0–0.1)
EOS PCT: 0.8 % (ref 0.0–5.0)
Eosinophils Absolute: 0.1 10*3/uL (ref 0.0–0.7)
HCT: 40.9 % (ref 36.0–46.0)
HEMOGLOBIN: 13.6 g/dL (ref 12.0–15.0)
LYMPHS ABS: 2.2 10*3/uL (ref 0.7–4.0)
Lymphocytes Relative: 31.8 % (ref 12.0–46.0)
MCHC: 33.1 g/dL (ref 30.0–36.0)
MCV: 87.8 fl (ref 78.0–100.0)
MONO ABS: 0.5 10*3/uL (ref 0.1–1.0)
Monocytes Relative: 6.7 % (ref 3.0–12.0)
NEUTROS ABS: 4.1 10*3/uL (ref 1.4–7.7)
NEUTROS PCT: 60.3 % (ref 43.0–77.0)
PLATELETS: 294 10*3/uL (ref 150.0–400.0)
RBC: 4.66 Mil/uL (ref 3.87–5.11)
RDW: 12.8 % (ref 11.5–15.5)
WBC: 6.9 10*3/uL (ref 4.0–10.5)

## 2015-09-28 LAB — VITAMIN D 25 HYDROXY (VIT D DEFICIENCY, FRACTURES): VITD: 34.28 ng/mL (ref 30.00–100.00)

## 2015-09-28 LAB — VITAMIN B12: Vitamin B-12: 433 pg/mL (ref 211–911)

## 2015-09-29 LAB — MANUAL DIFFERENTIAL
Atypical Lymphocytes Manual: 7 % — ABNORMAL HIGH
BANDS MAN: 12 % — AB (ref 0–5)
BASOS MAN: 0 % (ref 0–1)
Blasts Manual: 0 %
EOSINO MAN: 1 % (ref 0–5)
Lymphocytes Manual: 24 % (ref 12–46)
METAMY MAN: 0 %
MONO MAN: 1 % — AB (ref 3–12)
Myelocytes Manual: 0 %
Neutrophils Manual: 55 % (ref 43–77)

## 2015-09-29 LAB — HEPATITIS PANEL, ACUTE
HCV AB: NEGATIVE
HEP A IGM: NONREACTIVE
HEP B C IGM: NONREACTIVE
Hepatitis B Surface Ag: NEGATIVE

## 2015-09-29 LAB — HIV ANTIBODY (ROUTINE TESTING W REFLEX): HIV: NONREACTIVE

## 2015-09-29 LAB — RPR

## 2015-09-29 LAB — LACTATE DEHYDROGENASE: LDH: 119 U/L (ref 94–250)

## 2015-09-29 LAB — MONONUCLEOSIS SCREEN: Heterophile, Mono Screen: NEGATIVE

## 2015-09-29 LAB — ANA: ANA: NEGATIVE

## 2015-10-03 ENCOUNTER — Other Ambulatory Visit (HOSPITAL_COMMUNITY)
Admission: RE | Admit: 2015-10-03 | Discharge: 2015-10-03 | Disposition: A | Discharge status: Home / Self Care | Payer: Managed Care, Other (non HMO) | Source: Ambulatory Visit | Attending: Family Medicine | Admitting: Family Medicine

## 2015-10-03 ENCOUNTER — Ambulatory Visit (INDEPENDENT_AMBULATORY_CARE_PROVIDER_SITE_OTHER): Payer: Managed Care, Other (non HMO) | Admitting: Family Medicine

## 2015-10-03 ENCOUNTER — Encounter: Payer: Self-pay | Admitting: Family Medicine

## 2015-10-03 VITALS — BP 113/80 | HR 61 | Temp 98.5°F | Resp 20 | Wt 162.0 lb

## 2015-10-03 DIAGNOSIS — R591 Generalized enlarged lymph nodes: Secondary | ICD-10-CM | POA: Diagnosis not present

## 2015-10-03 DIAGNOSIS — Z124 Encounter for screening for malignant neoplasm of cervix: Secondary | ICD-10-CM

## 2015-10-03 DIAGNOSIS — Z113 Encounter for screening for infections with a predominantly sexual mode of transmission: Secondary | ICD-10-CM | POA: Insufficient documentation

## 2015-10-03 DIAGNOSIS — Z1151 Encounter for screening for human papillomavirus (HPV): Secondary | ICD-10-CM | POA: Diagnosis present

## 2015-10-03 DIAGNOSIS — Z01419 Encounter for gynecological examination (general) (routine) without abnormal findings: Secondary | ICD-10-CM | POA: Diagnosis present

## 2015-10-03 NOTE — Patient Instructions (Addendum)
I will call you with your PAP results once available, usually takes about 5 working days. I would encourage you to follow up with your GI for diagnostic lap. Please make certain he has the MRI report to review.   All your labs appear normal.  Depending on PAP results will decide out next move if any. Please continue to monitor areas for swelling or discomfort.  I would also like to get a cxr at your convenience. I have placed this order today.    Lymphadenopathy Lymphadenopathy refers to swollen or enlarged lymph glands, also called lymph nodes. Lymph glands are part of your body's defense (immune) system, which protects the body from infections, germs, and diseases. Lymph glands are found in many locations in your body, including the neck, underarm, and groin.  Many things can cause lymph glands to become enlarged. When your immune system responds to germs, such as viruses or bacteria, infection-fighting cells and fluid build up. This causes the glands to grow in size. Usually, this is not something to worry about. The swelling and any soreness often go away without treatment. However, swollen lymph glands can also be caused by a number of diseases. Your health care provider may do various tests to help determine the cause. If the cause of your swollen lymph glands cannot be found, it is important to monitor your condition to make sure the swelling goes away. HOME CARE INSTRUCTIONS Watch your condition for any changes. The following actions may help to lessen any discomfort you are feeling:  Get plenty of rest.  Take medicines only as directed by your health care provider. Your health care provider may recommend over-the-counter medicines for pain.  Apply moist heat compresses to the site of swollen lymph nodes as directed by your health care provider. This can help reduce any pain.  Check your lymph nodes daily for any changes.  Keep all follow-up visits as directed by your health care  provider. This is important. SEEK MEDICAL CARE IF:  Your lymph nodes are still swollen after 2 weeks.  Your swelling increases or spreads to other areas.  Your lymph nodes are hard, seem fixed to the skin, or are growing rapidly.  Your skin over the lymph nodes is red and inflamed.  You have a fever.  You have chills.  You have fatigue.  You develop a sore throat.  You have abdominal pain.  You have weight loss.  You have night sweats. SEEK IMMEDIATE MEDICAL CARE IF:  You notice fluid leaking from the area of the enlarged lymph node.  You have severe pain in any area of your body.  You have chest pain.  You have shortness of breath.   This information is not intended to replace advice given to you by your health care provider. Make sure you discuss any questions you have with your health care provider.   Document Released: 06/25/2008 Document Revised: 10/07/2014 Document Reviewed: 04/21/2014 Elsevier Interactive Patient Education [employer]2016 [employer]vier Inc.

## 2015-10-03 NOTE — Progress Notes (Signed)
Subjective:    Patient ID: Erika Gardner, female    DOB: 20-May-1976, 40 y.o.   MRN: [id number]  HPI  Lymphadenopathy: Patient presents for scheduled office visit today follow-up on lymphadenopathy. Lab work and MRI results was reviewed with patient today. Patient states that the right groin lymph node is still mildly tender, she doesn't feel like it is any worse or any better. She denies any vaginal discharge, change in sexual partners, vaginal irritation or abdominal pain. She states the only thing she can think of is her cycles have changed, and are less heavy. She has not followed through with the neurosurgeon that wanted to perform a diagnostic lap, because she did not want to go through with a surgery if not needed. She still on birth control pills daily. She still complains of increased lower back pain with menses. She has no history of endometriosis. Last Pap smear was in 2013, patient reports she's never had an abnormal Pap smear. Patient states she also feels that she has a lymph node under her right axilla. Patient had a breast cyst under her right breast, that was biopsied, per her report and was normal. Patient denies any fever, chills, night sweats or unintentional weight loss. Patient has had chronic complaints of fatigue, with appropriate workup.  Patient has had thyroid nodules, with recent ultrasound imaging showed unchanged small nodules. 0.9 cm solid right superior thyroid nodule. Maximal diameter may be minimally larger compared to the prior study. However, overall morphology of the nodule appears stable. This nodule does not meet size criteria for biopsy and follow-up ultrasound is recommended in 12 months.  Labs 09/28/2015: LDH: Normal, 119 B 12: Normal, 433 CBC: WBC 6.9, hemoglobin 13.6, hematocrit 40.9, MCV 87.8, RDW 12.8 platelets 294. RBC 4.66., Differential with mildly increased bands at 12, and atypical lymphocytes at 7. ANA negative, RPR nonreactive, HIV  nonreactive, hepatitis panel negative, mono heterophile negative Vitamin D 34.3  MRI: IMPRESSION: Mild fat stranding about a lymph node in the right groin is likely secondary to inflammatory change. The node is not pathologically enlarged by CT size criteria.  Normal-appearing hips.  Disc bulge at L5-S1 does not appear to cause central canal or foraminal narrowing.    Past Medical History  Diagnosis Date  . Anxiety   . Panic attacks   . B12 deficiency   . Asthma   . GERD (gastroesophageal reflux disease)   . Neuromuscular disorder (HCC)   . Thyroid disease     multinodular goiter  . Seizures (HCC)     x2 with a syncopal episode (03/2015- last episode)  . Fibromyalgia   . Vertigo   . Vitamin D deficiency   . Insomnia   . Snoring   . Carpal tunnel syndrome   . Abnormal mammogram of right breast 09/2012    stable hypoechoic lesion   Allergies  Allergen Reactions  . Hydromorphone Hcl Itching  . Savella [Milnacipran Hcl]     mood   Family History  Problem Relation Age of Onset  . Arthritis Maternal Grandmother   . Hearing loss Maternal Grandmother   . Cancer Maternal Grandfather     skin  . Heart disease Maternal Grandfather   . Arthritis Paternal Grandmother   . Cancer Paternal Grandfather     kidney  . Heart disease Paternal Grandfather   . Kidney disease Paternal Grandfather    Past Surgical History  Procedure Laterality Date  . Cholecystectomy    . Dilation and curettage of uterus  2007    Post miscarriage  . Knee arthroscopy Right 2014    wear and tear  . Hernia repair      umbilicus   Social History   Social History  . Marital Status: Married    Spouse Name: N/A  . Number of Children: N/A  . Years of Education: N/A   Occupational History  . Not on file.   Social History Main Topics  . Smoking status: Never Smoker   . Smokeless tobacco: Never Used  . Alcohol Use: Yes  . Drug Use: No  . Sexual Activity: Yes   Other Topics Concern  .  Not on file   Social History Narrative   Married. 3 children, stay at home mother. Father in law live with them as well. No alcohol, former smoker. No recreational drugs. Wears her seatbelt. Wears a bike helmet. Exercises at least 3 times a week. Smoke alarm in the home. There are firearms in the home.    Review of Systems Negative, with the exception of above mentioned in HPI     Objective:   Physical Exam BP 113/80 mmHg  Pulse 61  Temp(Src) 98.5 F (36.9 C)  Resp 20  Wt 162 lb (73.483 kg)  SpO2 99%  LMP 09/28/2015 Gen: Afebrile. No acute distress. Nontoxic in appearance, well-developed, well-nourished, Caucasian female. HENT: AT. [place]. Bilateral TM visualized and normal in appearance. MMM, no oral lesions. Bilateral nares erythema or swelling. Throat without erythema or exudates. No cough on exam, no hoarseness on exam. Good dentition, no gum erythema or drainage. Eyes:Pupils Equal Round Reactive to light, Extraocular movements intact,  Conjunctiva without redness, discharge or icterus. Neck/lymp/endocrine: Supple, right axilla, right coronary, right posterior submandibular lymphadenopathy, no thyromegaly CV: RRR Chest: CTAB, no wheeze or crackles Abd: Soft. NTND. BS present. No Masses palpated.  Skin: No rashes, purpura or petechiae.  Neuro: Normal gait. PERLA. EOMi. Alert. Oriented x3  Psych: Normal affect, dress and demeanor. Normal speech. Normal thought content and judgment.Marland Kitchen  GYN:  External genitalia within normal limits.  Vaginal mucosa pink, moist, normal rugae.  Mildly friable cervix with lesion (12 and 6 o'clock and multiple nabothian cyst, no discharge or bleeding noted on speculum exam.  Bimanual exam revealed normal, nongravid uterus.  No cervical motion tenderness. No adnexal masses bilaterally.       Assessment & Plan:  1. Cervical cancer screening - Cytology - PAP with HPV - Unusual appearing cervix with irritation, uncertain if lesion or result of multiple  nabothian cysts  2. Screening examination for STD (sexually transmitted disease) - GC/Chlamydia Probe Amp  3. Lymphadenopathy - Uncertain etiology, atypical lymphocytes on blood smear, otherwise normal labs. Will obtain chest x-ray to rule out sarcoid/TB or hilar lymphadenopathy. -Patient has no alarm symptoms. - Completed Pap today to rule out gynecological cause of right inguinal lymphadenitis, along with gonorrhea and chlamydia. - DG Chest 2 View; Future  Follow-up Dependent on chest x-ray and Pap

## 2015-10-04 ENCOUNTER — Ambulatory Visit (HOSPITAL_BASED_OUTPATIENT_CLINIC_OR_DEPARTMENT_OTHER)
Admission: RE | Admit: 2015-10-04 | Discharge: 2015-10-04 | Disposition: A | Discharge status: Home / Self Care | Payer: Managed Care, Other (non HMO) | Source: Ambulatory Visit | Attending: Family Medicine | Admitting: Family Medicine

## 2015-10-04 DIAGNOSIS — R591 Generalized enlarged lymph nodes: Secondary | ICD-10-CM | POA: Diagnosis not present

## 2015-10-04 DIAGNOSIS — R5383 Other fatigue: Secondary | ICD-10-CM | POA: Insufficient documentation

## 2015-10-04 LAB — GC/CHLAMYDIA PROBE AMP
CT Probe RNA: NOT DETECTED
GC Probe RNA: NOT DETECTED

## 2015-10-05 ENCOUNTER — Telehealth: Payer: Self-pay | Admitting: Family Medicine

## 2015-10-05 NOTE — Telephone Encounter (Signed)
Reviewed lab and xray results with patient 

## 2015-10-05 NOTE — Telephone Encounter (Signed)
Please call patient: - Her gonorrhea and Chlamydia are negative. Her chest x-ray is negative/normal.  - I'm still awaiting her Pap smear results. Once I received this I will call her with a plan to proceed if we need to further investigate/workup her lymphadenopathy. Just make sure she had her results prior to the weekend in the event we do not get her Paps result prior.

## 2015-10-06 LAB — CYTOLOGY - PAP

## 2015-10-09 ENCOUNTER — Telehealth: Payer: Self-pay | Admitting: *Deleted

## 2015-10-09 DIAGNOSIS — I889 Nonspecific lymphadenitis, unspecified: Secondary | ICD-10-CM

## 2015-10-09 DIAGNOSIS — R799 Abnormal finding of blood chemistry, unspecified: Secondary | ICD-10-CM

## 2015-10-09 NOTE — Telephone Encounter (Signed)
Patient called inquiring about PAP results. Also c/o continued cramping since having PAP . Patient would like to know if this is normal. Please advise.

## 2015-10-09 NOTE — Telephone Encounter (Signed)
Pt PAP results came back today, it was normal with negative HPV. Cramping should not continue after a PAP.  - As far as her lymphadenitis (swelling of her glands) I would like to place a referral to hematology to further work up. I have placed this referral for her and she should be hearing from them soon.

## 2015-10-10 NOTE — Telephone Encounter (Signed)
Spoke with patient reviewed Pap results and information regarding referral.

## 2015-10-17 ENCOUNTER — Telehealth: Payer: Self-pay | Admitting: *Deleted

## 2015-10-17 NOTE — Telephone Encounter (Signed)
Patient called and left message inquiring about reason for Hematology referral .  Spoke with patient reviewed with her Dr Blenda Bridegroom note and referral reason. Patient verbalized understanding.

## 2015-10-25 ENCOUNTER — Telehealth: Payer: Self-pay | Admitting: *Deleted

## 2015-10-25 NOTE — Telephone Encounter (Signed)
Called and spoke with patient reminding her of her appointment tomorrow at [facility]. Patient verbalized understanding.

## 2015-10-26 ENCOUNTER — Ambulatory Visit (HOSPITAL_BASED_OUTPATIENT_CLINIC_OR_DEPARTMENT_OTHER): Payer: Managed Care, Other (non HMO) | Admitting: Oncology

## 2015-10-26 ENCOUNTER — Telehealth: Payer: Self-pay | Admitting: Oncology

## 2015-10-26 VITALS — BP 114/65 | HR 85 | Temp 98.3°F | Resp 18 | Wt 157.8 lb

## 2015-10-26 DIAGNOSIS — M25551 Pain in right hip: Secondary | ICD-10-CM

## 2015-10-26 DIAGNOSIS — L049 Acute lymphadenitis, unspecified: Secondary | ICD-10-CM

## 2015-10-26 DIAGNOSIS — I889 Nonspecific lymphadenitis, unspecified: Secondary | ICD-10-CM | POA: Diagnosis not present

## 2015-10-26 DIAGNOSIS — Z8051 Family history of malignant neoplasm of kidney: Secondary | ICD-10-CM

## 2015-10-26 DIAGNOSIS — Z808 Family history of malignant neoplasm of other organs or systems: Secondary | ICD-10-CM

## 2015-10-26 NOTE — Progress Notes (Signed)
Please see consult note.  

## 2015-10-26 NOTE — Telephone Encounter (Signed)
per pof to sch pt appt-gave pt copy of avs °

## 2015-10-26 NOTE — Consult Note (Signed)
Reason for Referral: Enlarged lymph gland.   HPI: 40 year old woman native of [place] but currently resides in [facility]. She has a history of fibromyalgia, GERD as well as vitamin B12 deficiency. She started noticing right-sided groin and pelvic pain for the last 8 weeks. Her evaluation included MRI of the pelvis with and without contrast on 09/22/2015. The MRI showed mild fat stranding around the right groin with possible enlargement of an inguinal lymph node secondary to inflammatory changes. No pathologically enlarged lymph gland by CT criteria. Her laboratory testing including CBC and chemistries all within normal range. Her differential was normal at that time. She underwent laparoscopic exploration on 10/18/2015 done at the outpatient surgery center of [facility] as well as a and appendectomy. Her appendix was retrocecal in nature but no clear-cut appendicitis noted. No other abnormalities noted intraoperatively. She recovered fairly well at this time and does report some occasional soreness. Her groin swelling and pain have improved. She does not report any pathological lymphadenopathy. She does report some small cervical and periauricular right-sided lymph glands. She denied any fevers, chills or sweats. She did not any weight loss or appetite changes. She does have chronic fatigue which have not changed. He also does report chronic changes in her bowel habits which have not changed. She continues to perform activities of daily living without any decline. She is scheduled to return to work next week.  She does not report any headaches, blurry vision, syncope or seizures. She does not report any fevers or chills or sweats. She does not report any chest pain, palpitation orthopnea. She does not report any cough, wheezing or hemoptysis. She does not report any nausea, vomiting does report abdominal pain at times. She does not report any hematochezia or melena. She does not report  any frequency urgency or hesitancy. She does not report any skeletal complaints. Remaining review of systems unremarkable.   Past Medical History  Diagnosis Date  . Anxiety   . Panic attacks   . B12 deficiency   . Asthma   . GERD (gastroesophageal reflux disease)   . Neuromuscular disorder (HCC)   . Thyroid disease     multinodular goiter  . Seizures (HCC)     x2 with a syncopal episode (03/2015- last episode)  . Fibromyalgia   . Vertigo   . Vitamin D deficiency   . Insomnia   . Snoring   . Carpal tunnel syndrome   . Abnormal mammogram of right breast 09/2012    stable hypoechoic lesion  :  Past Surgical History  Procedure Laterality Date  . Cholecystectomy    . Dilation and curettage of uterus  2007    Post miscarriage  . Knee arthroscopy Right 2014    wear and tear  . Hernia repair      umbilicus  :   Current outpatient prescriptions:  .  albuterol (PROVENTIL HFA;VENTOLIN HFA) 108 (90 BASE) MCG/ACT inhaler, Inhale 2 puffs into the lungs every 6 (six) hours as needed for wheezing or shortness of breath., Disp: 1 Inhaler, Rfl: 0 .  Cholecalciferol 3000 units TABS, Take 1 tablet by mouth daily., Disp: 90 tablet, Rfl: 3 .  escitalopram (LEXAPRO) 10 MG tablet, Take 1 tablet (10 mg total) by mouth daily., Disp: 90 tablet, Rfl: 1 .  folic acid (FOLVITE) 1 MG tablet, TAKE 1 TABLET DAILY., Disp: , Rfl:  .  LORazepam (ATIVAN) 0.5 MG tablet, Take 1 tablet (0.5 mg total) by mouth at bedtime., Disp: 30  tablet, Rfl: 1 .  norethindrone-ethinyl estradiol (JUNEL FE,GILDESS FE,LOESTRIN FE) 1-20 MG-MCG tablet, Take 1 tablet by mouth daily., Disp: , Rfl:  .  omeprazole (PRILOSEC) 20 MG capsule, Take 1 capsule (20 mg total) by mouth daily., Disp: 30 capsule, Rfl: 2 .  vitamin B-12 (CYANOCOBALAMIN) 1000 MCG tablet, Take 1 tablet (1,000 mcg total) by mouth daily., Disp: 90 tablet, Rfl: 3:  Allergies  Allergen Reactions  . Hydromorphone Hcl Itching  . Savella [Milnacipran Hcl]     mood   :  Family History  Problem Relation Age of Onset  . Arthritis Maternal Grandmother   . Hearing loss Maternal Grandmother   . Cancer Maternal Grandfather     skin  . Heart disease Maternal Grandfather   . Arthritis Paternal Grandmother   . Cancer Paternal Grandfather     kidney  . Heart disease Paternal Grandfather   . Kidney disease Paternal Grandfather   :  Social History   Social History  . Marital Status: Married    Spouse Name: N/A  . Number of Children: N/A  . Years of Education: N/A   Occupational History  . Not on file.   Social History Main Topics  . Smoking status: Never Smoker   . Smokeless tobacco: Never Used  . Alcohol Use: Yes  . Drug Use: No  . Sexual Activity: Yes   Other Topics Concern  . Not on file   Social History Narrative   Married. 3 children, stay at home mother. Father in law live with them as well. No alcohol, former smoker. No recreational drugs. Wears her seatbelt. Wears a bike helmet. Exercises at least 3 times a week. Smoke alarm in the home. There are firearms in the home.  :  Pertinent items are noted in HPI.  Exam: Blood pressure 114/65, pulse 85, temperature 98.3 F (36.8 C), temperature source Oral, resp. rate 18, weight 157 lb 12.8 oz (71.578 kg), last menstrual period 09/28/2015, SpO2 100 %. General appearance: alert and cooperative Head: Normocephalic, without obvious abnormality Throat: lips, mucosa, and tongue normal; teeth and gums normal Neck: no adenopathy Back: negative Resp: clear to auscultation bilaterally Chest wall: no tenderness Cardio: regular rate and rhythm, S1, S2 normal, no murmur, click, rub or gallop GI: soft, non-tender; bowel sounds normal; no masses,  no organomegaly Extremities: extremities normal, atraumatic, no cyanosis or edema Skin: Skin color, texture, turgor normal. No rashes or lesions  CBC    Component Value Date/Time   WBC 6.9 09/28/2015 1352   WBC 5.3 09/18/2009 0808   RBC 4.66  09/28/2015 1352   RBC 4.13 09/18/2009 0808   RBC 4.09 09/18/2009 0808   HGB 13.6 09/28/2015 1352   HGB 12.3 09/18/2009 0808   HCT 40.9 09/28/2015 1352   HCT 35.0 09/18/2009 0808   PLT 294.0 09/28/2015 1352   PLT 191 09/18/2009 0808   MCV 87.8 09/28/2015 1352   MCV 86 09/18/2009 0808   MCH 30.0 09/18/2009 0808   MCHC 33.1 09/28/2015 1352   MCHC 35.1 09/18/2009 0808   RDW 12.8 09/28/2015 1352   RDW 12.2 09/18/2009 0808   LYMPHSABS 2.2 09/28/2015 1352   LYMPHSABS 1.7 09/18/2009 0808   MONOABS 0.5 09/28/2015 1352   EOSABS 0.1 09/28/2015 1352   EOSABS 0.1 09/18/2009 0808   BASOSABS 0.0 09/28/2015 1352   BASOSABS 0.0 09/18/2009 0808      Chemistry      Component Value Date/Time   NA 136 06/01/2015 1054   K 4.3 06/01/2015  1054   CL 104 06/01/2015 1054   CO2 24 06/01/2015 1054   BUN 11 06/01/2015 1054   CREATININE 0.86 06/01/2015 1054   CREATININE .8 11/05/2008 1505      Component Value Date/Time   CALCIUM 9.0 06/01/2015 1054   ALKPHOS 62 06/01/2015 1054   AST 18 06/01/2015 1054   ALT 14 06/01/2015 1054   BILITOT 0.4 06/01/2015 1054      IMPRESSION: Mild fat stranding about a lymph node in the right groin is likely secondary to inflammatory change. The node is not pathologically enlarged by CT size criteria.  Normal-appearing hips.  Disc bulge at L5-S1 does not appear to cause central canal or foraminal narrowing.     Dg Chest 2 View  10/05/2015  CLINICAL DATA:  Lymphadenopathy and fatigue. Swollen lymph node in the right groin for 1 month and swelling in the right axilla. EXAM: CHEST  2 VIEW COMPARISON:  Chest CT 11/16/2008 FINDINGS: Normal heart size and mediastinal contours. No change in the mediastinal appearance compared to previous CT scanogram to suggest adenopathy. No infiltrate or edema. No effusion or pneumothorax. Splenic shadow has a normal appearance. Cholecystectomy. IMPRESSION: Negative chest.  No evidence of intrathoracic adenopathy. Electronically  Signed   By: Marnee Spring M.D.   On: 10/05/2015 08:19    Assessment and Plan:    40 year old woman with the following issues:  1. Lymphadenitis involving her right groin likely reactive in nature and do not think this is a sign of a lymphoproliferative disorder likely hematological malignancy. This appears to be reactive in nature could be related to early appendicitis or other inflammatory process. I see no signs or symptoms to suggest lymphoma at this time. There is no clear-cut lymphadenopathy on her exam and imaging studies including MRI and chest x-ray does not support any abnormal findings.  Her laboratory data were also reviewed including peripheral smear which showed perfectly normal findings.  I have recommended repeat imaging studies and for pelvis and groin to ensure resolution of these findings. If the imaging studies are close to normal at that time no further workup will be needed.  2. Vitamin B12 deficiency: Unclear etiology at this time and currently receiving B12 injections and oral vitamin B12 supplements.  3. Follow-up: Will be in 4 months to repeat imaging studies and gave my final recommendation at that time.

## 2015-10-31 ENCOUNTER — Other Ambulatory Visit: Payer: Self-pay | Admitting: *Deleted

## 2015-10-31 ENCOUNTER — Other Ambulatory Visit: Payer: Self-pay | Admitting: Family Medicine

## 2015-10-31 DIAGNOSIS — F411 Generalized anxiety disorder: Secondary | ICD-10-CM

## 2015-10-31 MED ORDER — LORAZEPAM 0.5 MG PO TABS: 0.5000 mg | ORAL_TABLET | Freq: Every day | ORAL | Status: DC

## 2015-10-31 NOTE — Telephone Encounter (Signed)
Received a pharmacy refill request for patients Lorazepam. Last OV 09/04/16 last refill 08/31/2015 with 1 refill

## 2015-11-10 ENCOUNTER — Ambulatory Visit: Payer: Managed Care, Other (non HMO) | Admitting: Family Medicine

## 2015-11-13 ENCOUNTER — Ambulatory Visit (INDEPENDENT_AMBULATORY_CARE_PROVIDER_SITE_OTHER): Payer: Managed Care, Other (non HMO) | Admitting: Family Medicine

## 2015-11-13 ENCOUNTER — Encounter: Payer: Self-pay | Admitting: Family Medicine

## 2015-11-13 VITALS — BP 112/76 | HR 77 | Temp 98.1°F | Resp 20 | Wt 160.5 lb

## 2015-11-13 DIAGNOSIS — R591 Generalized enlarged lymph nodes: Secondary | ICD-10-CM

## 2015-11-13 DIAGNOSIS — E559 Vitamin D deficiency, unspecified: Secondary | ICD-10-CM | POA: Diagnosis not present

## 2015-11-13 DIAGNOSIS — F411 Generalized anxiety disorder: Secondary | ICD-10-CM

## 2015-11-13 MED ORDER — ESCITALOPRAM OXALATE 10 MG PO TABS: 10.0000 mg | ORAL_TABLET | Freq: Every day | ORAL | Status: DC

## 2015-11-13 NOTE — Patient Instructions (Signed)
7 weeks for lab appt only. 6 months for f/u on anxiety/meds. Refills today for 6 months.

## 2015-11-13 NOTE — Progress Notes (Signed)
Patient ID: Erika Gardner, female   DOB: [birth year], 40 y.o.   MRN: [id number]   Subjective:    Patient ID: Erika Gardner, female    DOB: 12-Jun-1976, 40 y.o.   MRN: [id number]  HPI Patient presents for follow-up on anxiety today.Thea Alken is using the Ativan 0.5 mg nightly, and continues to take Lexapro 10 mg daily. She eventually would like to wean off these medications, as she feels like she only needed these secondary to a stressful time of like she was going through. She states she will attempt to wean after over acute illness on the ativan.   Recent appendectomy: Exp lap for reactive lymphadenics, she had her app removed.  Node. Statees her lypph was better until recent infection at surgical site.   Past Medical History  Diagnosis Date  . Anxiety   . Panic attacks   . B12 deficiency   . Asthma   . GERD (gastroesophageal reflux disease)   . Neuromuscular disorder (HCC)   . Thyroid disease     multinodular goiter  . Seizures (HCC)     x2 with a syncopal episode (03/2015- last episode)  . Fibromyalgia   . Vertigo   . Vitamin D deficiency   . Insomnia   . Snoring   . Carpal tunnel syndrome   . Abnormal mammogram of right breast 09/2012    stable hypoechoic lesion   Allergies  Allergen Reactions  . Hydromorphone Hcl Itching  . Savella [Milnacipran Hcl]     mood    Review of Systems Negative, with the exception of above mentioned in HPI     Objective:   Physical Exam There were no vitals taken for this visit. Gen: Afebrile. No acute distress. Nontoxic in appearance, well-developed, well-nourished, Caucasian female. Appears well. HENT: AT. [place].MMM.  Eyes:Pupils Equal Round Reactive to light, Extraocular movements intact,  Conjunctiva without redness, discharge or icterus. ABD: Soft, NTND. Well healing umbilical and port incisions. No drainage or erythema. No LAD. Neuro: Normal gait. PERLA. EOMi. Alert. Oriented x3  Psych: Normal affect, dress and demeanor.  Normal speech. Normal thought content and judgment..     Assessment & Plan:  1. Vitamin D deficiency/Vitmain b12  - lab appt in 7 weeks.  - VITAMIN D 25 Hydroxy (Vit-D Deficiency, Fractures); Future - B12; Future  2. Lymphadenopathy - resolving, evaluated by onc.  - recent lap appendectomy.  - F/U with onc/heme in 4 months  3. Generalized anxiety disorder - refill on lexapro today, ativan refills given last appt for 4 months -  follow up 6 months.

## 2015-11-22 ENCOUNTER — Ambulatory Visit (INDEPENDENT_AMBULATORY_CARE_PROVIDER_SITE_OTHER): Payer: Managed Care, Other (non HMO) | Admitting: Family Medicine

## 2015-11-22 ENCOUNTER — Encounter: Payer: Self-pay | Admitting: Family Medicine

## 2015-11-22 VITALS — BP 117/84 | HR 93 | Temp 98.1°F | Resp 20 | Wt 162.8 lb

## 2015-11-22 DIAGNOSIS — R109 Unspecified abdominal pain: Secondary | ICD-10-CM

## 2015-11-22 DIAGNOSIS — R100 Acute abdomen: Secondary | ICD-10-CM

## 2015-11-22 NOTE — Patient Instructions (Signed)
Please report to nearest ED.  Abdominal Pain, Adult Many things can cause abdominal pain. Usually, abdominal pain is not caused by a disease and will improve without treatment. It can often be observed and treated at home. Your health care provider will do a physical exam and possibly order blood tests and X-rays to help determine the seriousness of your pain. However, in many cases, more time must pass before a clear cause of the pain can be found. Before that point, your health care provider may not know if you need more testing or further treatment. HOME CARE INSTRUCTIONS Monitor your abdominal pain for any changes. The following actions may help to alleviate any discomfort you are experiencing:  Only take over-the-counter or prescription medicines as directed by your health care provider.  Do not take laxatives unless directed to do so by your health care provider.  Try a clear liquid diet (broth, tea, or water) as directed by your health care provider. Slowly move to a bland diet as tolerated. SEEK MEDICAL CARE IF:  You have unexplained abdominal pain.  You have abdominal pain associated with nausea or diarrhea.  You have pain when you urinate or have a bowel movement.  You experience abdominal pain that wakes you in the night.  You have abdominal pain that is worsened or improved by eating food.  You have abdominal pain that is worsened with eating fatty foods.  You have a fever. SEEK IMMEDIATE MEDICAL CARE IF:  Your pain does not go away within 2 hours.  You keep throwing up (vomiting).  Your pain is felt only in portions of the abdomen, such as the right side or the left lower portion of the abdomen.  You pass bloody or black tarry stools. MAKE SURE YOU:  Understand these instructions.  Will watch your condition.  Will get help right away if you are not doing well or get worse.   This information is not intended to replace advice given to you by your health care  provider. Make sure you discuss any questions you have with your health care provider.   Document Released: 06/26/2005 Document Revised: 06/07/2015 Document Reviewed: 05/26/2013 Elsevier Interactive Patient Education [employer]

## 2015-11-22 NOTE — Progress Notes (Signed)
Ilena Dieckman , 1976-02-08, 40 y.o., female MRN: [id number]  CC: Abdominal pain  Subjective: Pt presents for an acute OV with complaints of diffuse abdominal pain  of 2 days duration. Associated symptoms include diffuse abd pain, chills, nausea, lightheadedness. She has had a normal BM, last one this morning. No hematochezia/melana. Pt feels symptoms are worse with sitting up. Pt has not tried anything, but tums to ease their symptoms, without relief. Tolerating PO, but causes nausea. She has not had any sick contacts. No dysuria or urinary frequency. Not using prilosec. Recent Exploratory lap with appendectomy. She then had  a Skin infection of umbilical incision and was placed on kelfex, which she completed 1 week ago.  Patient reports periumbilical pain, that started yesterday but   has been constant since 4 am and rates pain a 9/10 and describes as sharp pain.   Allergies  Allergen Reactions  . Hydromorphone Hcl Itching  . Savella [Milnacipran Hcl]     mood   Social History  Substance Use Topics  . Smoking status: Never Smoker   . Smokeless tobacco: Never Used  . Alcohol Use: Yes   Past Medical History  Diagnosis Date  . Anxiety   . Panic attacks   . B12 deficiency   . Asthma   . GERD (gastroesophageal reflux disease)   . Neuromuscular disorder (HCC)   . Thyroid disease     multinodular goiter  . Seizures (HCC)     x2 with a syncopal episode (03/2015- last episode)  . Fibromyalgia   . Vertigo   . Vitamin D deficiency   . Insomnia   . Snoring   . Carpal tunnel syndrome   . Abnormal mammogram of right breast 09/2012    stable hypoechoic lesion   Past Surgical History  Procedure Laterality Date  . Cholecystectomy    . Dilation and curettage of uterus  2007    Post miscarriage  . Knee arthroscopy Right 2014    wear and tear  . Hernia repair      umbilicus  . Appendectomy     Family History  Problem Relation Age of Onset  . Arthritis Maternal Grandmother   .  Hearing loss Maternal Grandmother   . Cancer Maternal Grandfather     skin  . Heart disease Maternal Grandfather   . Arthritis Paternal Grandmother   . Cancer Paternal Grandfather     kidney  . Heart disease Paternal Grandfather   . Kidney disease Paternal Grandfather      Medication List       This list is accurate as of: 11/22/15  9:41 AM.  Always use your most recent med list.               albuterol 108 (90 Base) MCG/ACT inhaler  Commonly known as:  PROVENTIL HFA;VENTOLIN HFA  Inhale 2 puffs into the lungs every 6 (six) hours as needed for wheezing or shortness of breath.     Cholecalciferol 3000 units Tabs  Take 1 tablet by mouth daily.     escitalopram 10 MG tablet  Commonly known as:  LEXAPRO  Take 1 tablet (10 mg total) by mouth daily.     folic acid 1 MG tablet  Commonly known as:  FOLVITE  TAKE 1 TABLET DAILY.     LORazepam 0.5 MG tablet  Commonly known as:  ATIVAN  Take 1 tablet (0.5 mg total) by mouth at bedtime.     norethindrone-ethinyl estradiol 1-20 MG-MCG tablet  Commonly known as:  JUNEL FE,GILDESS FE,LOESTRIN FE  Take 1 tablet by mouth daily.     omeprazole 20 MG capsule  Commonly known as:  PRILOSEC  Take 1 capsule (20 mg total) by mouth daily.     vitamin B-12 1000 MCG tablet  Commonly known as:  CYANOCOBALAMIN  Take 1 tablet (1,000 mcg total) by mouth daily.       ROS: Negative, with the exception of above mentioned in HPI  Objective:  BP 117/84 mmHg  Pulse 93  Temp(Src) 98.1 F (36.7 C)  Resp 20  Wt 162 lb 12 oz (73.823 kg)  SpO2 97%  LMP 10/23/2015 (Approximate) Body mass index is 27.08 kg/(m^2). Gen: Afebrile.  Lying on exam table. Appears distressed. Well developed, well nourished. Pain with movement.  HENT: AT. [place]. Tacky MM, no oral lesions.  Eyes:Pupils Equal Round Reactive to light, Extraocular movements intact,  Conjunctiva without redness, discharge or icterus. CV: RRR  Abd: Soft, with mild distention above umbilicus.  Diffuse TTP, with >periumbilical tenderness. Rebound tenderness, guarding present. BS present. No  Masses palpated. NO HSM. +obturator sign, +peritonitis signs with heel jar +.  Skin: No  rashes, purpura or petechiae. No skin changes, well healed incisions, without erythema or drainage. Neuro: PERLA. EOMi. Alert. Oriented x3 .   Assessment/Plan: Brizeyda Holtmeyer is a 40 y.o. female present for acute OV f 1. Acute abdominal pain - pt encouraged to report to ED. Father was available to drive her.  - recent EXP lap with appendectomy, now with concerns of acute abdomen. Constant 9/10 pain since 4 am. Exam concerning with + rebound/guarding and special test.  - F/U 1 week or sooner as indicated by findings in ED   electronically signed by: Felix Pacini, DO  [facility] [place]

## 2015-11-24 ENCOUNTER — Encounter: Payer: Self-pay | Admitting: Family Medicine

## 2015-11-24 ENCOUNTER — Ambulatory Visit (INDEPENDENT_AMBULATORY_CARE_PROVIDER_SITE_OTHER): Payer: Managed Care, Other (non HMO) | Admitting: Family Medicine

## 2015-11-24 VITALS — BP 104/71 | HR 78 | Temp 98.3°F | Resp 20 | Wt 158.5 lb

## 2015-11-24 DIAGNOSIS — R100 Acute abdomen: Secondary | ICD-10-CM | POA: Diagnosis not present

## 2015-11-24 DIAGNOSIS — R319 Hematuria, unspecified: Secondary | ICD-10-CM | POA: Diagnosis not present

## 2015-11-24 DIAGNOSIS — R7309 Other abnormal glucose: Secondary | ICD-10-CM

## 2015-11-24 DIAGNOSIS — R109 Unspecified abdominal pain: Secondary | ICD-10-CM

## 2015-11-24 LAB — CBC WITH DIFFERENTIAL/PLATELET
BASOS ABS: 0 10*3/uL (ref 0.0–0.1)
Basophils Relative: 0.4 % (ref 0.0–3.0)
EOS PCT: 1 % (ref 0.0–5.0)
Eosinophils Absolute: 0 10*3/uL (ref 0.0–0.7)
HEMATOCRIT: 41.1 % (ref 36.0–46.0)
Hemoglobin: 14 g/dL (ref 12.0–15.0)
LYMPHS ABS: 1.4 10*3/uL (ref 0.7–4.0)
LYMPHS PCT: 30 % (ref 12.0–46.0)
MCHC: 34 g/dL (ref 30.0–36.0)
MCV: 86.3 fl (ref 78.0–100.0)
MONOS PCT: 9.6 % (ref 3.0–12.0)
Monocytes Absolute: 0.5 10*3/uL (ref 0.1–1.0)
NEUTROS PCT: 59 % (ref 43.0–77.0)
Neutro Abs: 2.8 10*3/uL (ref 1.4–7.7)
Platelets: 287 10*3/uL (ref 150.0–400.0)
RBC: 4.76 Mil/uL (ref 3.87–5.11)
RDW: 13.3 % (ref 11.5–15.5)
WBC: 4.7 10*3/uL (ref 4.0–10.5)

## 2015-11-24 LAB — POCT URINALYSIS DIPSTICK
GLUCOSE UA: NEGATIVE
LEUKOCYTES UA: NEGATIVE
Nitrite, UA: NEGATIVE
SPEC GRAV UA: 1.025
UROBILINOGEN UA: 1
pH, UA: 6

## 2015-11-24 LAB — H. PYLORI ANTIBODY, IGG: H Pylori IgG: NEGATIVE

## 2015-11-24 LAB — POCT GLYCOSYLATED HEMOGLOBIN (HGB A1C): HEMOGLOBIN A1C: 5.4

## 2015-11-24 MED ORDER — CEPHALEXIN 500 MG PO CAPS: 500.0000 mg | ORAL_CAPSULE | Freq: Three times a day (TID) | ORAL | Status: DC

## 2015-11-24 NOTE — Progress Notes (Signed)
Patient ID: Erika Gardner, female   DOB: 08/25/1976, 40 y.o.   MRN: [id number]    Erika Gardner , 1976/05/16, 40 y.o., female MRN: [id number] female MRN: [id number]  CC: Abdominal pain follow-up. Subjective:   Abdominal pain: Patient presents for follow-up to abdominal pain in which she was evaluated 2 days ago. Patient was sent to the emergency room secondary to pain level and exam. Patient was found to have an elevated white count of 14.3, elevated glucose of 116, large blood in her urine, mildly tachycardic, CT abdomen with small umbilical herniation with fat, otherwise normal CT. Patient was evaluated with a pelvic exam, with negative gonorrhea and chlamydia PCR. Negative wet prep. Negative pregnancy test. Patient was prescribed PPI and Zofran, she states she still has decreased appetite and feels nauseous when eating. She was treated for presumed gonorrhea and Chlamydia/PID with ceftriaxone and azithromycin 1 g. Patient states the pain is still present, although not as severe as when she was in our office. She was given tramadol for pain. Patient is 4 pounds lighter than when she was here 2 days ago. She denies any fevers, chills or vomit. She had one episode of loose stool.   Office note 11/22/2015 Pt presents for an acute OV with complaints of diffuse abdominal pain  of 2 days duration. Associated symptoms include diffuse abd pain, chills, nausea, lightheadedness. She has had a normal BM, last one this morning. No hematochezia/melana. Pt feels symptoms are worse with sitting up. Pt has not tried anything, but tums to ease their symptoms, without relief. Tolerating PO, but causes nausea. She has not had any sick contacts. No dysuria or urinary frequency. Not using prilosec. Recent Exploratory lap with appendectomy. She then had  a Skin infection of umbilical incision and was placed on kelfex, which she completed 1 week ago.  Patient reports periumbilical pain, that started yesterday but   has been constant since 4  am and rates pain a 9/10 and describes as sharp pain.   Allergies  Allergen Reactions  . Hydromorphone Hcl Itching  . Savella [Milnacipran Hcl]     mood   Social History  Substance Use Topics  . Smoking status: Never Smoker   . Smokeless tobacco: Never Used  . Alcohol Use: Yes   Past Medical History  Diagnosis Date  . Anxiety   . Panic attacks   . B12 deficiency   . Asthma   . GERD (gastroesophageal reflux disease)   . Neuromuscular disorder (HCC)   . Thyroid disease     multinodular goiter  . Seizures (HCC)     x2 with a syncopal episode (03/2015- last episode)  . Fibromyalgia   . Vertigo   . Vitamin D deficiency   . Insomnia   . Snoring   . Carpal tunnel syndrome   . Abnormal mammogram of right breast 09/2012    stable hypoechoic lesion   Past Surgical History  Procedure Laterality Date  . Cholecystectomy    . Dilation and curettage of uterus  2007    Post miscarriage  . Knee arthroscopy Right 2014    wear and tear  . Hernia repair      umbilicus  . Appendectomy     Family History  Problem Relation Age of Onset  . Arthritis Maternal Grandmother   . Hearing loss Maternal Grandmother   . Cancer Maternal Grandfather     skin  . Heart disease Maternal Grandfather   . Arthritis Paternal Grandmother   . Cancer Paternal Grandfather  kidney  . Heart disease Paternal Grandfather   . Kidney disease Paternal Grandfather      Medication List       This list is accurate as of: 11/24/15 10:52 AM.  Always use your most recent med list.               albuterol 108 (90 Base) MCG/ACT inhaler  Commonly known as:  PROVENTIL HFA;VENTOLIN HFA  Inhale 2 puffs into the lungs every 6 (six) hours as needed for wheezing or shortness of breath.     CARAFATE 1 g tablet  Generic drug:  sucralfate  Take by mouth.     Cholecalciferol 3000 units Tabs  Take 1 tablet by mouth daily.     escitalopram 10 MG tablet  Commonly known as:  LEXAPRO  Take 1 tablet (10 mg  total) by mouth daily.     folic acid 1 MG tablet  Commonly known as:  FOLVITE  TAKE 1 TABLET DAILY.     LORazepam 0.5 MG tablet  Commonly known as:  ATIVAN  Take 1 tablet (0.5 mg total) by mouth at bedtime.     norethindrone-ethinyl estradiol 1-20 MG-MCG tablet  Commonly known as:  JUNEL FE,GILDESS FE,LOESTRIN FE  Take 1 tablet by mouth daily.     omeprazole 20 MG capsule  Commonly known as:  PRILOSEC  Take 1 capsule (20 mg total) by mouth daily.     traMADol 50 MG tablet  Commonly known as:  ULTRAM  Take by mouth.     vitamin B-12 1000 MCG tablet  Commonly known as:  CYANOCOBALAMIN  Take 1 tablet (1,000 mcg total) by mouth daily.     ZOFRAN 4 MG tablet  Generic drug:  ondansetron  Take by mouth.       ROS: Negative, with the exception of above mentioned in HPI  Objective:  BP 104/71 mmHg  Pulse 78  Temp(Src) 98.3 F (36.8 C)  Resp 20  Wt 158 lb 8 oz (71.895 kg)  SpO2 98%  LMP 10/23/2015 (Approximate) Body mass index is 26.38 kg/(m^2). Gen: Afebrile. Nontoxic in appearance, well developed, well nourished, appears tired, but improved  HENT: AT. [place]. MMM, no oral lesions.  Eyes:Pupils Equal Round Reactive to light, Extraocular movements intact,  Conjunctiva without redness, discharge or icterus. CV: RRR  Abd: Soft, with mild distention above umbilicus. No tenderness to palpation. Umbilical, improved from last visit. Mild pressure/tenderness suprapubic. No rebound or guarding today.  Skin: No  rashes, purpura or petechiae. No skin changes, well healed incisions, without erythema or drainage. Neuro: PERLA. EOMi. Alert. Oriented x3 .   Assessment/Plan: Rachal Dvorsky is a 40 y.o. female present for acute OV f Acute abdominal pain, mild improvement, still symptomatic/elevated glucose/elevated WBC/hematuria - prescribed zofran and PPI in ED.Marland KitchenContinue medications. H.Pylori collected today - Told PID and given Rocephin and azith-->PCR G/C negative, pt was also  negative 10/03/2015 without sexual intercourse since. - CT with small fat filled umbilical hernia, and fluid filled colon, otherwise no acute findings on CT. Patient encouraged to call her surgeon to inform of small umbilical hernia, in the event that would take, complication. - Urine with large blood in ED, confirmed patient was not on her menstrual cycle.. --> elevated WBC 14.3. Repeat urinalysis today with large hematuria, protein and ketones. We'll send for micro-and reflex and culture. In review of history through [facility] it appears she has had large blood in her urine back to 2015 ,  when no urinary tract  infection or culture showed infection. - elevated fasting glucose and ED (116) --> a1c today is normal--> 5.4 . Reassured patient she is not even in the prediabetic range. - Repeat CBC and H. pylori collected today. Urine sent for micro-and culture. Patient treated with Keflex 3 times a day 7 days. Patient will need clean catch UA in 4 weeks to ensure hematuria has cleared. If hematuria is still present at that time she'll need to be referred to urology.  electronically signed by: Felix Pacini, DO  [facility] - [place]

## 2015-11-24 NOTE — Patient Instructions (Addendum)
I am going to treat you with keflex. This will cover urinary infections. You have a large amount of blood in your urine, it was present during your ed visit as well. Keflex for 7 days, three times a day,.   Call your surgeon to discuss any follow as well.  A1c 5.4, no concerns for diabetes. I will call you with lab results once available.  I will need to recheck your urine in 4 weeks. Lab appt only.   Hematuria, Adult Hematuria is blood in your urine. It can be caused by a bladder infection, kidney infection, prostate infection, kidney stone, or cancer of your urinary tract. Infections can usually be treated with medicine, and a kidney stone usually will pass through your urine. If neither of these is the cause of your hematuria, further workup to find out the reason may be needed. It is very important that you tell your health care provider about any blood you see in your urine, even if the blood stops without treatment or happens without causing pain. Blood in your urine that happens and then stops and then happens again can be a symptom of a very serious condition. Also, pain is not a symptom in the initial stages of many urinary cancers. HOME CARE INSTRUCTIONS   Drink lots of fluid, 3-4 quarts a day. If you have been diagnosed with an infection, cranberry juice is especially recommended, in addition to large amounts of water.  Avoid caffeine, tea, and carbonated beverages because they tend to irritate the bladder.  Avoid alcohol because it may irritate the prostate.  Take all medicines as directed by your health care provider.  If you were prescribed an antibiotic medicine, finish it all even if you start to feel better.  If you have been diagnosed with a kidney stone, follow your health care provider's instructions regarding straining your urine to catch the stone.  Empty your bladder often. Avoid holding urine for long periods of time.  After a bowel movement, women should cleanse  front to back. Use each tissue only once.  Empty your bladder before and after sexual intercourse if you are a female. SEEK MEDICAL CARE IF:  You develop back pain.  You have a fever.  You have a feeling of sickness in your stomach (nausea) or vomiting.  Your symptoms are not better in 3 days. Return sooner if you are getting worse. SEEK IMMEDIATE MEDICAL CARE IF:   You develop severe vomiting and are unable to keep the medicine down.  You develop severe back or abdominal pain despite taking your medicines.  You begin passing a large amount of blood or clots in your urine.  You feel extremely weak or faint, or you pass out. MAKE SURE YOU:   Understand these instructions.  Will watch your condition.  Will get help right away if you are not doing well or get worse.   This information is not intended to replace advice given to you by your health care provider. Make sure you discuss any questions you have with your health care provider.   Document Released: 09/16/2005 Document Revised: 10/07/2014 Document Reviewed: 05/17/2013 Elsevier Interactive Patient Education [employer]

## 2015-11-25 LAB — URINALYSIS, ROUTINE W REFLEX MICROSCOPIC
Bilirubin Urine: NEGATIVE
GLUCOSE, UA: NEGATIVE
LEUKOCYTES UA: NEGATIVE
Nitrite: NEGATIVE
Specific Gravity, Urine: 1.024 (ref 1.001–1.035)
pH: 6 (ref 5.0–8.0)

## 2015-11-25 LAB — URINALYSIS, MICROSCOPIC ONLY
CASTS: NONE SEEN [LPF]
CRYSTALS: NONE SEEN [HPF]
Yeast: NONE SEEN [HPF]

## 2015-11-27 ENCOUNTER — Telehealth: Payer: Self-pay | Admitting: Family Medicine

## 2015-11-27 NOTE — Telephone Encounter (Signed)
Patient called left message stating she is now having pain in her lower back and radiating down her legs. Spoke with patient reviewed lab results and scheduled patient to be seen 11/28/15

## 2015-11-27 NOTE — Telephone Encounter (Signed)
Please call patient, her repeat labs are within normal range. The white count she had during her last visit is resolved and has normalized. The H. pylori we checked for her stomach pain is negative. - we will need to check her urine in 3-4 weeks to make sure that the blood has resolved, if it has not I will send her to urology.

## 2015-11-28 ENCOUNTER — Ambulatory Visit: Payer: Managed Care, Other (non HMO) | Admitting: Family Medicine

## 2015-11-28 LAB — URINE CULTURE

## 2015-12-19 ENCOUNTER — Telehealth: Payer: Self-pay | Admitting: Family Medicine

## 2015-12-19 ENCOUNTER — Other Ambulatory Visit (INDEPENDENT_AMBULATORY_CARE_PROVIDER_SITE_OTHER): Payer: Managed Care, Other (non HMO)

## 2015-12-19 DIAGNOSIS — E559 Vitamin D deficiency, unspecified: Secondary | ICD-10-CM

## 2015-12-19 DIAGNOSIS — R319 Hematuria, unspecified: Secondary | ICD-10-CM

## 2015-12-19 LAB — POCT URINALYSIS DIPSTICK
Bilirubin, UA: NEGATIVE
Glucose, UA: NEGATIVE
Ketones, UA: NEGATIVE
NITRITE UA: NEGATIVE
PROTEIN UA: NEGATIVE
SPEC GRAV UA: 1.02
UROBILINOGEN UA: 0.2
pH, UA: 5.5

## 2015-12-19 LAB — VITAMIN B12: VITAMIN B 12: 628 pg/mL (ref 200–1100)

## 2015-12-19 NOTE — Telephone Encounter (Addendum)
Please call pt: - Vit D (43) and B12 (628) are great. Continue current dose.  - she still has moderate blood in her urine. - urology referral placed for her today for further evaluation.   Documentation only: hematuria history since 2015 in prior records (without menses or infection at the time of collection). Vague Lower abd pain becoming chronic.

## 2015-12-19 NOTE — Addendum Note (Signed)
Addended by: Doree BarthelLOWE, CASANDRA on: 12/19/2015 11:21 AM   Modules accepted: Orders

## 2015-12-20 LAB — VITAMIN D 25 HYDROXY (VIT D DEFICIENCY, FRACTURES): VIT D 25 HYDROXY: 43 ng/mL (ref 30–100)

## 2015-12-20 NOTE — Telephone Encounter (Signed)
Left message on patient voice mail with lab results and referral information per DPR.

## 2016-01-01 ENCOUNTER — Other Ambulatory Visit: Payer: Managed Care, Other (non HMO)

## 2016-01-02 ENCOUNTER — Ambulatory Visit (INDEPENDENT_AMBULATORY_CARE_PROVIDER_SITE_OTHER): Payer: Managed Care, Other (non HMO) | Admitting: Family Medicine

## 2016-01-02 ENCOUNTER — Encounter: Payer: Self-pay | Admitting: Family Medicine

## 2016-01-02 ENCOUNTER — Telehealth: Payer: Self-pay | Admitting: Family Medicine

## 2016-01-02 VITALS — BP 109/77 | HR 96 | Temp 98.4°F | Resp 20 | Wt 159.8 lb

## 2016-01-02 DIAGNOSIS — M5440 Lumbago with sciatica, unspecified side: Secondary | ICD-10-CM | POA: Insufficient documentation

## 2016-01-02 DIAGNOSIS — M5430 Sciatica, unspecified side: Secondary | ICD-10-CM | POA: Diagnosis not present

## 2016-01-02 DIAGNOSIS — M545 Low back pain: Secondary | ICD-10-CM | POA: Diagnosis not present

## 2016-01-02 NOTE — Telephone Encounter (Signed)
Patient's OBGYN thinks her pelvic pain is coming from her back. Patient's chiropractor agrees. Patient decided during conversation to just make an appt here this afternoon.

## 2016-01-02 NOTE — Progress Notes (Signed)
Patient ID: Erika Gardner, female   DOB: January 27, 1976, 40 y.o.   MRN: [id number]   Subjective:    Patient ID: Erika Gardner, female    DOB: 1976-06-01, 40 y.o.   MRN: [id number]  HPI   Lumbar pain/Right pelvic pain/thigh pain/ intermittent sciatica:  Patient presents for a follow up office visit with complaints of right pelvic pain that started August 13, 2015. She was seen in urgent care at that time, and they had concerns for right inguinal hernia. Patient reports no trauma, no heavy lifting, no increase in activity. She has denied any urinary or bowel movement difficulties. She has known degenerative disc disease L5/S1 with sciatica with bulging disc.  She has a known history of ovarian cyst. Patient was referred to general surgery, which completed a CT scan that was normal with the exception of minimal pelvic fluid. She had an appendectomy and diagnostic lap. She reports the pain in her pelvis is the same as it has been. It is a dull aching, with occasional sharp shooting pain. She states mostly the pain radiates along her inguinal ligament (patient points to this area), but sometimes she feels a shooting pain down to her inner thigh, and sometimes lateral leg to calf. She denies hip joint popping, catching or clicking of back or hips. She states the pain is worse with sitting and better when she is not moving. She denies fever or chills. She was prescribed tramadol for her pain, but she states she is unable to take ask that makes her anxious. She has difficulty taking NSAIDs secondary to her stomach pain.  Patient has now had a urological evaluation for persistent hematuria and was told her studies were normal without cause for her hematuria. She was seen by [facility] with cystoscopy studies. She has been seen by gynecology, and is prescribed BCP and spirolactone. GYN doe snot feel it is a GYN issue. She has had multiple cultures and PAP. She had an [place] in the office, that was "normal" per  pt. She has a Land, which I believe she is related, tell her at one time it was ligament pain and then now possibly coming from her back.   MRI pelvis 09/14/16: FINDINGS: There is mild fat stranding with postcontrast enhancement in the right groin. A single somewhat prominent lymph node measures 1.0 cm, not pathologically enlarged by CT size criteria. No intrapelvic lymphadenopathy is identified. No fluid collection or mass is seen. All imaged bones demonstrate normal signal. There is no hip joint effusion or evidence of bursitis. All muscles and tendons are intact. Imaged intrapelvic contents are unremarkable. Note is made the patient has a shallow appearing disc bulge at L5-S1 which does not appear to cause central canal or foraminal stenosis. IMPRESSION: Mild fat stranding about a lymph node in the right groin is likely secondary to inflammatory change. The node is not pathologically enlarged by CT size criteria. Normal-appearing hips. Disc bulge at L5-S1 does not appear to cause central canal or foraminal narrowing   CT ABd/Pelvis 08/17/2015 at [facility]: IMPRESSION: No acute abnormality identified.   Result Narrative  TECHNIQUE: Axial CT abdomen and pelvis after 80cc Isovue-370 contrast. Radiation dose reduction was utilized (automated exposure control, mA or kV adjustment based on patient size, or iterative image reconstruction). COMPARISON:  11/18/14 INDICATION: R10.31: Right lower quadrant pain  FINDINGS:  CHEST: Lung bases clear.   ABDOMEN AND PELVIS: Liver: Within normal limits. Gallbladder and Bile ducts: Cholecystectomy. Pancreas: Within normal limits. Spleen: Within normal limits.  Adrenals: Within normal limits.  GI tract: Within normal limits. Appendix: Within normal limits.  Kidneys: Within normal limits.  Ureters: Within normal limits. Urinary bladder: Within normal limits. Reproductive: Within normal limits.  Vascular: Within normal  limits.  Peritoneum/Extraperitoneum: Trace pelvic fluid. Small nonspecific lymph nodes in the ileocolic mesentery.  Musculoskeletal: No acute osseous abnormality.    Never smoker  Past Medical History  Diagnosis Date  . Anxiety   . Panic attacks   . B12 deficiency   . Asthma   . GERD (gastroesophageal reflux disease)   . Neuromuscular disorder (HCC)   . Thyroid disease     multinodular goiter  . Seizures (HCC)     x2 with a syncopal episode (03/2015- last episode)  . Fibromyalgia   . Vertigo   . Vitamin D deficiency   . Insomnia   . Snoring   . Carpal tunnel syndrome   . Abnormal mammogram of right breast 09/2012    stable hypoechoic lesion   Allergies  Allergen Reactions  . Hydromorphone Hcl Itching  . Savella [Milnacipran Hcl]     mood   Review of Systems Negative, with the exception of above mentioned in HPI     Objective:   Physical Exam BP 109/77 mmHg  Pulse 96  Temp(Src) 98.4 F (36.9 C)  Resp 20  Wt 159 lb 12 oz (72.462 kg)  SpO2 98% Gen: Afebrile. No acute distress. Nontoxic in appearance, well-developed, well-nourished, Caucasian female. Abd: Soft. Flat. ND. Tenderness to deep palpation right pelvis, just superior to right inguinal ligament. BS present. No Masses palpated. No rebounding, no guarding. MSK: No erythema, no step off or bone tenderness lumbar spine. No paraspinal fullness. Mild RT SI tenderness. Decreased back extension with mild discomfort, decreased flexion with moderate mid-lower back pain and discomfort with returning to neutral position. Normal rotation and SB.  Full range of motion of right hip, mild discomfort internal rotation bilateral hips. negative straight leg raises bilaterally.posiitve FABRE bilaterally for rt SI and midline lowerback pain. DTR equal bilaterally. neurovascularly intact distally. Neuro: Normal gait. Easily mobile on exam table. PERLA. EOMi. Alert. Oriented x3. Cranial nerves II through XII intact. MS 5/5 UE/LE.       Assessment & Plan:  Back pain of lumbar region with sciatica/ Pain, joint, pelvic region or thigh, right: - Prior CT abd/pelvis report at [facility] included in Note and available under media tab.   - MRI pelvis report per above, will try to avoid additional lumbar MRI if able. MRI shows L5/S1 disc bulging. May need to order if neuro does not feel current studies adequate.  - [facility].  - continue flexeril by GYN.  - Ambulatory referral to Neurology  Electronically Signed by: Felix Pacinienee Kuneff, DO [facility]- [place]

## 2016-01-02 NOTE — Patient Instructions (Signed)
I will place a referral for the physician you requested. We made need to obtain Lumbar MRI.  We will call you once we have you referred or need to order MRI Enjoy your spring break!

## 2016-02-08 ENCOUNTER — Ambulatory Visit: Payer: Managed Care, Other (non HMO) | Admitting: Podiatry

## 2016-02-12 ENCOUNTER — Other Ambulatory Visit (HOSPITAL_COMMUNITY): Payer: Self-pay | Admitting: Physical Medicine and Rehabilitation

## 2016-02-12 DIAGNOSIS — M5126 Other intervertebral disc displacement, lumbar region: Secondary | ICD-10-CM

## 2016-02-13 ENCOUNTER — Encounter: Payer: Self-pay | Admitting: *Deleted

## 2016-02-13 ENCOUNTER — Ambulatory Visit (HOSPITAL_COMMUNITY): Admission: RE | Admit: 2016-02-13 | Payer: Managed Care, Other (non HMO) | Source: Ambulatory Visit

## 2016-02-13 ENCOUNTER — Ambulatory Visit (HOSPITAL_COMMUNITY)
Admission: RE | Admit: 2016-02-13 | Discharge: 2016-02-13 | Disposition: A | Discharge status: Home / Self Care | Payer: Managed Care, Other (non HMO) | Source: Ambulatory Visit | Attending: Physical Medicine and Rehabilitation | Admitting: Physical Medicine and Rehabilitation

## 2016-02-13 ENCOUNTER — Ambulatory Visit (HOSPITAL_COMMUNITY): Payer: Managed Care, Other (non HMO)

## 2016-02-13 DIAGNOSIS — M5126 Other intervertebral disc displacement, lumbar region: Secondary | ICD-10-CM | POA: Insufficient documentation

## 2016-02-14 ENCOUNTER — Encounter: Payer: Self-pay | Admitting: *Deleted

## 2016-02-14 ENCOUNTER — Other Ambulatory Visit: Payer: Self-pay | Admitting: Oncology

## 2016-02-14 DIAGNOSIS — R591 Generalized enlarged lymph nodes: Secondary | ICD-10-CM

## 2016-02-15 ENCOUNTER — Ambulatory Visit: Payer: Managed Care, Other (non HMO) | Admitting: Oncology

## 2016-02-21 ENCOUNTER — Telehealth: Payer: Self-pay | Admitting: Oncology

## 2016-02-21 ENCOUNTER — Other Ambulatory Visit: Payer: Self-pay | Admitting: Oncology

## 2016-02-21 NOTE — Telephone Encounter (Signed)
left msg for 6/12 apt

## 2016-02-22 ENCOUNTER — Ambulatory Visit (HOSPITAL_COMMUNITY): Payer: Managed Care, Other (non HMO)

## 2016-02-29 ENCOUNTER — Ambulatory Visit (HOSPITAL_COMMUNITY): Payer: Managed Care, Other (non HMO)

## 2016-03-11 ENCOUNTER — Ambulatory Visit: Payer: Managed Care, Other (non HMO) | Admitting: Oncology

## 2016-03-21 ENCOUNTER — Telehealth: Payer: Self-pay | Admitting: Oncology

## 2016-03-21 ENCOUNTER — Ambulatory Visit (HOSPITAL_BASED_OUTPATIENT_CLINIC_OR_DEPARTMENT_OTHER): Payer: Managed Care, Other (non HMO) | Admitting: Oncology

## 2016-03-21 ENCOUNTER — Other Ambulatory Visit: Payer: Self-pay | Admitting: *Deleted

## 2016-03-21 ENCOUNTER — Ambulatory Visit (HOSPITAL_BASED_OUTPATIENT_CLINIC_OR_DEPARTMENT_OTHER): Payer: Managed Care, Other (non HMO)

## 2016-03-21 VITALS — BP 109/81 | HR 74 | Temp 98.9°F | Resp 18 | Ht 65.0 in | Wt 161.9 lb

## 2016-03-21 DIAGNOSIS — R591 Generalized enlarged lymph nodes: Secondary | ICD-10-CM

## 2016-03-21 DIAGNOSIS — M5186 Other intervertebral disc disorders, lumbar region: Secondary | ICD-10-CM | POA: Diagnosis not present

## 2016-03-21 DIAGNOSIS — R599 Enlarged lymph nodes, unspecified: Secondary | ICD-10-CM

## 2016-03-21 LAB — COMPREHENSIVE METABOLIC PANEL
ALBUMIN: 3.5 g/dL (ref 3.5–5.0)
ALK PHOS: 69 U/L (ref 40–150)
ALT: 17 U/L (ref 0–55)
ANION GAP: 9 meq/L (ref 3–11)
AST: 17 U/L (ref 5–34)
BUN: 11.2 mg/dL (ref 7.0–26.0)
CALCIUM: 9.3 mg/dL (ref 8.4–10.4)
CO2: 24 mEq/L (ref 22–29)
CREATININE: 0.8 mg/dL (ref 0.6–1.1)
Chloride: 106 mEq/L (ref 98–109)
EGFR: >90 mL/min/{1.73_m2} (ref >90)
Glucose: 97 mg/dl (ref 70–140)
POTASSIUM: 4.3 meq/L (ref 3.5–5.1)
Sodium: 139 mEq/L (ref 136–145)
Total Bilirubin: 0.36 mg/dL (ref 0.20–1.20)
Total Protein: 7.4 g/dL (ref 6.4–8.3)

## 2016-03-21 NOTE — Telephone Encounter (Signed)
per pof to NOT sch appt at this time-pt aware

## 2016-03-21 NOTE — Progress Notes (Signed)
Hematology and Oncology Follow Up Visit  Erika Gardner [id number] 1976-01-31 40 y.o. 03/21/2016 10:23 AM Erika Claiborne BillingsKuneff, DOKuneff, Erika A, DO   Principle Diagnosis: 40 year old woman with right inguinal adenopathy related to lymphadenitis versus lymphoproliferative disorder. This was detected on MRI in December 2016.  Current therapy: Observation and surveillance.  Interim History: Erika Gardner presents today for a follow-up visit. Since the last visit, she had an MRI of the lumbar spine which showed a ruptured disc around L5. She continues to report chronic back pain but not interfering with her function. He is able to ambulate and does not report any neurological deficits. She does report some right-sided groin discomfort and enlargement at times. She denied any fevers or chills or constitutional symptoms.  She does not report any headaches, blurry vision, syncope or seizures. She does not report any fevers or chills or sweats. She does not report any chest pain, palpitation orthopnea. She does not report any cough, wheezing or hemoptysis. She does not report any nausea, vomiting does report abdominal pain at times. She does not report any hematochezia or melena. She does not report any frequency urgency or hesitancy. She does not report any skeletal complaints. Remaining review of systems unremarkable  Medications: I have reviewed the patient's current medications.  Current Outpatient Prescriptions  Medication Sig Dispense Refill  . albuterol (PROVENTIL HFA;VENTOLIN HFA) 108 (90 BASE) MCG/ACT inhaler Inhale 2 puffs into the lungs every 6 (six) hours as needed for wheezing or shortness of breath. 1 Inhaler 0  . Cholecalciferol 3000 units TABS Take 1 tablet by mouth daily. 90 tablet 3  . cyclobenzaprine (FLEXERIL) 5 MG tablet     . escitalopram (LEXAPRO) 10 MG tablet Take 1 tablet (10 mg total) by mouth daily. 90 tablet 1  . folic acid (FOLVITE) 1 MG tablet TAKE 1 TABLET DAILY.    Marland Kitchen.  LORazepam (ATIVAN) 0.5 MG tablet Take 1 tablet (0.5 mg total) by mouth at bedtime. 30 tablet 4  . norethindrone-ethinyl estradiol (JUNEL FE,GILDESS FE,LOESTRIN FE) 1-20 MG-MCG tablet Take 1 tablet by mouth daily.    Marland Kitchen. omeprazole (PRILOSEC) 20 MG capsule Take 1 capsule (20 mg total) by mouth daily. 30 capsule 2  . spironolactone (ALDACTONE) 50 MG tablet     . sucralfate (CARAFATE) 1 g tablet Take by mouth.    . vitamin B-12 (CYANOCOBALAMIN) 1000 MCG tablet Take 1 tablet (1,000 mcg total) by mouth daily. 90 tablet 3   No current facility-administered medications for this visit.     Allergies:  Allergies  Allergen Reactions  . Hydromorphone Hcl Itching  . Savella [Milnacipran Hcl]     mood    Past Medical History, Surgical history, Social history, and Family History were reviewed and updated.   Physical Exam: Blood pressure 109/81, pulse 74, temperature 98.9 F (37.2 C), temperature source Oral, resp. rate 18, height 5\' 5"  (1.651 m), weight 161 lb 14.4 oz (73.437 kg), SpO2 100 %. ECOG: 0 General appearance: alert and cooperative Head: Normocephalic, without obvious abnormality Neck: no adenopathy Lymph nodes: Cervical, supraclavicular, and axillary nodes normal. I cannot appreciate any inguinal adenopathy. Heart:regular rate and rhythm, S1, S2 normal, no murmur, click, rub or gallop Lung:chest clear, no wheezing, rales, normal symmetric air entry Abdomin: soft, non-tender, without masses or organomegaly EXT:no erythema, induration, or nodules   Lab Results: Lab Results  Component Value Date   WBC 4.7 11/24/2015   HGB 14.0 11/24/2015   HCT 41.1 11/24/2015   MCV 86.3 11/24/2015   PLT  287.0 11/24/2015     Chemistry      Component Value Date/Time   NA 136 06/01/2015 1054   K 4.3 06/01/2015 1054   CL 104 06/01/2015 1054   CO2 24 06/01/2015 1054   BUN 11 06/01/2015 1054   CREATININE 0.86 06/01/2015 1054   CREATININE .8 11/05/2008 1505      Component Value Date/Time    CALCIUM 9.0 06/01/2015 1054   ALKPHOS 62 06/01/2015 1054   AST 18 06/01/2015 1054   ALT 14 06/01/2015 1054   BILITOT 0.4 06/01/2015 1054       Radiological Studies: EXAM: MRI LUMBAR SPINE WITHOUT CONTRAST  TECHNIQUE: Multiplanar, multisequence MR imaging of the lumbar spine was performed. No intravenous contrast was administered.  COMPARISON: None.  FINDINGS: Vertebral body height and alignment are maintained. A few scattered small hemangiomas are identified but no worrisome marrow lesion is seen. The conus medullaris is normal in signal and position. Imaged intra-abdominal contents appear normal.  T12-L1 is imaged in the sagittal plane only and negative.  L1-2: Negative.  L2-3: Negative.  L3-4: Negative.  L4-5: Shallow disc bulge and ligamentum flavum thickening without central canal or foraminal stenosis.  L5-S1: Broad-based central disc protrusion contacts the S1 roots in lateral recess without compression or displacement. The thecal sac is widely patent. The foramina are open.  IMPRESSION: Broad-based central protrusion at L5-S1 contacts the S1 roots in the lateral recesses without compression or displacement. The thecal sac and foramina are widely patent at this level.  Mild disc bulge and ligamentum flavum thickening L4-5 without central canal or foraminal narrowing.     Impression and Plan:  40 year old woman with the following issues:  1. Lymphadenitis involving her right groin likely reactive in nature and do not think this is a sign of a lymphoproliferative disorder likely hematological malignancy. She continues to have symptoms with pelvic discomfort and a right groin fullness. These are likely related to 2 for ruptured disc but certainly a lymphoproliferative disorder needs to be ruled out.  I recommended repeat imaging studies including abdomen and pelvis CT scan in the near future. If there is no evidence of lymphadenopathy at  this time, no further evaluation will be needed. If we are dealing with lymphadenopathy at that time, a tissue biopsy will be the next step.  2. L5- S1 disc disease: She continues to follow up with surgery regarding this issue.  3. Follow-up: Will be depending on the results of her scan.   [facility] Inc[facility]ADAD,Tyjah Hai, MD 6/22/201710:23 AM

## 2016-03-21 NOTE — Addendum Note (Signed)
Addended by: Reesa ChewSMITH, Bryanne Riquelme on: 03/21/2016 10:50 AM   Modules accepted: Orders, Medications

## 2016-03-21 NOTE — Telephone Encounter (Signed)
sent back to lab per Dixie-she will put in orders for CMET

## 2016-04-10 ENCOUNTER — Telehealth: Payer: Self-pay | Admitting: *Deleted

## 2016-04-10 NOTE — Telephone Encounter (Signed)
"  I saw Dr. Clelia CroftShadad June 22 nd 2017 and awaiting scans of abdomen and pelvis.  Could someone let me know what's going on?" 03-21-2016 Referral reads 'denied'.  I saw him because insurance said I needed an appointment before scans.  It's been three weeks now.  Can someone get this authorized and call me so I'll know what's going on."    Will notify Dr. Clelia CroftShadad for next steps.

## 2016-05-06 ENCOUNTER — Other Ambulatory Visit: Payer: Self-pay | Admitting: Neurosurgery

## 2016-05-08 ENCOUNTER — Encounter (HOSPITAL_COMMUNITY): Payer: Self-pay

## 2016-05-09 ENCOUNTER — Encounter (HOSPITAL_COMMUNITY)
Admission: RE | Admit: 2016-05-09 | Discharge: 2016-05-09 | Disposition: A | Discharge status: Home / Self Care | Payer: Managed Care, Other (non HMO) | Source: Ambulatory Visit | Attending: Neurosurgery | Admitting: Neurosurgery

## 2016-05-09 ENCOUNTER — Encounter (HOSPITAL_COMMUNITY): Payer: Self-pay

## 2016-05-09 DIAGNOSIS — M5117 Intervertebral disc disorders with radiculopathy, lumbosacral region: Secondary | ICD-10-CM | POA: Diagnosis not present

## 2016-05-09 DIAGNOSIS — F419 Anxiety disorder, unspecified: Secondary | ICD-10-CM | POA: Diagnosis not present

## 2016-05-09 DIAGNOSIS — K219 Gastro-esophageal reflux disease without esophagitis: Secondary | ICD-10-CM | POA: Diagnosis not present

## 2016-05-09 DIAGNOSIS — G4730 Sleep apnea, unspecified: Secondary | ICD-10-CM | POA: Diagnosis not present

## 2016-05-09 DIAGNOSIS — J45909 Unspecified asthma, uncomplicated: Secondary | ICD-10-CM | POA: Diagnosis not present

## 2016-05-09 DIAGNOSIS — R531 Weakness: Secondary | ICD-10-CM | POA: Diagnosis not present

## 2016-05-09 DIAGNOSIS — R262 Difficulty in walking, not elsewhere classified: Secondary | ICD-10-CM | POA: Diagnosis not present

## 2016-05-09 DIAGNOSIS — M797 Fibromyalgia: Secondary | ICD-10-CM | POA: Diagnosis not present

## 2016-05-09 HISTORY — DX: Sleep apnea, unspecified: G47.30

## 2016-05-09 HISTORY — DX: Other complications of anesthesia, initial encounter: T88.59XA

## 2016-05-09 HISTORY — DX: Adverse effect of unspecified anesthetic, initial encounter: T41.45XA

## 2016-05-09 LAB — BASIC METABOLIC PANEL
Anion gap: 5 (ref 5–15)
BUN: 8 mg/dL (ref 6–20)
CO2: 25 mmol/L (ref 22–32)
CREATININE: 0.82 mg/dL (ref 0.44–1.00)
Calcium: 9.2 mg/dL (ref 8.9–10.3)
Chloride: 105 mmol/L (ref 101–111)
Glucose, Bld: 97 mg/dL (ref 65–99)
POTASSIUM: 3.9 mmol/L (ref 3.5–5.1)
SODIUM: 135 mmol/L (ref 135–145)

## 2016-05-09 LAB — HCG, SERUM, QUALITATIVE: Preg, Serum: NEGATIVE

## 2016-05-09 LAB — CBC
HEMATOCRIT: 42.2 % (ref 36.0–46.0)
Hemoglobin: 14 g/dL (ref 12.0–15.0)
MCH: 29.6 pg (ref 26.0–34.0)
MCHC: 33.2 g/dL (ref 30.0–36.0)
MCV: 89.2 fL (ref 78.0–100.0)
PLATELETS: 257 10*3/uL (ref 150–400)
RBC: 4.73 MIL/uL (ref 3.87–5.11)
RDW: 12.3 % (ref 11.5–15.5)
WBC: 6.3 10*3/uL (ref 4.0–10.5)

## 2016-05-09 LAB — SURGICAL PCR SCREEN
MRSA, PCR: POSITIVE — AB
STAPHYLOCOCCUS AUREUS: POSITIVE — AB

## 2016-05-09 MED ORDER — CEFAZOLIN SODIUM-DEXTROSE 2-4 GM/100ML-% IV SOLN
2.0000 g | INTRAVENOUS | Status: AC
  Administered 2016-05-10: 2 g via INTRAVENOUS
  Filled 2016-05-09: qty 100

## 2016-05-09 NOTE — Pre-Procedure Instructions (Signed)
Erika Gardner  05/09/2016      [id number] - [place], [place] - [address] [place] [id number] Phone: [phone] Fax: [phone]    Your procedure is scheduled on 05/10/2016  [facility] Admitting at 8:00 A.M.  Call this number if you have problems the morning of surgery:  [phone]   Remember:  Do not eat food or drink liquids after midnight.  Take these medicines the morning of surgery with A SIP OF WATER : pain medicine &/or Ativan  is ok the morning of surgery   Do not wear jewelry, make-up or nail polish.    Do not wear lotions, powders, or perfumes.  You may wear deoderant.  Do not shave 48 hours prior to surgery.     Do not bring valuables to the hospital.   [facility] is not responsible for any belongings or valuables.  Contacts, dentures or bridgework may not be worn into surgery.  Leave your suitcase in the car.  After surgery it may be brought to your room.  For patients admitted to the hospital, discharge time will be determined by your treatment team.  Patients discharged the day of surgery will not be allowed to drive home.   Name and phone number of your driver:   With family  Special instructions:  Special Instructions: [place] - Preparing for Surgery  Before surgery, you can play an important role.  Because skin is not sterile, your skin needs to be as free of germs as possible.  You can reduce the number of germs on you skin by washing with CHG (chlorahexidine gluconate) soap before surgery.  CHG is an antiseptic cleaner which kills germs and bonds with the skin to continue killing germs even after washing.  Please DO NOT use if you have an allergy to CHG or antibacterial soaps.  If your skin becomes reddened/irritated stop using the CHG and inform your nurse when you arrive at Short Stay.  Do not shave (including legs and underarms) for at least 48 hours prior to the  first CHG shower.  You may shave your face.  Please follow these instructions carefully:   1.  Shower with CHG Soap the night before surgery and the  morning of Surgery.  2.  If you choose to wash your hair, wash your hair first as usual with your  normal shampoo.  3.  After you shampoo, rinse your hair and body thoroughly to remove the  Shampoo.  4.  Use CHG as you would any other liquid soap.  You can apply chg directly to the skin and wash gently with scrungie or a clean washcloth.  5.  Apply the CHG Soap to your body ONLY FROM THE NECK DOWN.    Do not use on open wounds or open sores.  Avoid contact with your eyes, ears, mouth and genitals (private parts).  Wash genitals (private parts)   with your normal soap.  6.  Wash thoroughly, paying special attention to the area where your surgery will be performed.  7.  Thoroughly rinse your body with warm water from the neck down.  8.  DO NOT shower/wash with your normal soap after using and rinsing off   the CHG Soap.  9.  Pat yourself dry with a clean towel.            10.  Wear clean pajamas.  11.  Place clean sheets on your bed the night of your first shower and do not sleep with pets.  Day of Surgery  Do not apply any lotions/deodorants the morning of surgery.  Please wear clean clothes to the hospital/surgery center.  Please read over the following fact sheets that you were given. Pain Booklet, Coughing and Deep Breathing, MRSA Information and Surgical Site Infection Prevention

## 2016-05-09 NOTE — Progress Notes (Signed)
Followed by Dr. Claiborne BillingsKuneff in [place] [facility]. Pt. Denies cardiac or pulmonary concerns.  Pt.incidentally remarking about an abscess she had in the pelvic area a yr. Ago, during a trip to [place].  Pt. Reports that it was incised  & packed& she was given P.O. Antibiotic. Unsure of what the area cultured.

## 2016-05-09 NOTE — H&P (Signed)
Erika Gardner is an 40 y.o. female.   Chief Complaint: lumbar pain HPI: patient with a history of lumbar p[ain which lately is radiating to both legs first to the left and now to the rightno better with conservative treatment. Mri was done   Past Medical History:  Diagnosis Date  . Abnormal mammogram of right breast 09/2012   stable hypoechoic lesion  . Anxiety   . Asthma    somewhat sounding like exercise induced relative to temperature   . B12 deficiency   . Carpal tunnel syndrome    both wrists  . Complication of anesthesia    pt. reports that the combination of morphine & / or dilaudid with anesthesia  presents with itching    . Fibromyalgia   . GERD (gastroesophageal reflux disease)   . Insomnia   . Neuromuscular disorder ([place])   . Panic attacks   . Seizures ([place])    x2 with a syncopal episode (03/2015- last episode)  . Sleep apnea    not needing treatment, through a sleep study done showing that if she is on her back she has slight sleep apnea.    . Snoring   . Thyroid disease    multinodular goiter  . Vertigo   . Vitamin D deficiency     Past Surgical History:  Procedure Laterality Date  . APPENDECTOMY    . CHOLECYSTECTOMY    . DILATION AND CURETTAGE OF UTERUS  2007   Post miscarriage  . HERNIA REPAIR     umbilicus  . KNEE ARTHROSCOPY Right 2014   wear and tear  . VAGINAL DELIVERY     x3    Family History  Problem Relation Age of Onset  . Arthritis Maternal Grandmother   . Hearing loss Maternal Grandmother   . Cancer Maternal Grandfather     skin  . Heart disease Maternal Grandfather   . Arthritis Paternal Grandmother   . Cancer Paternal Grandfather     kidney  . Heart disease Paternal Grandfather   . Kidney disease Paternal Grandfather    Social History:  reports that she has never smoked. She has never used smokeless tobacco. She reports that she drinks alcohol. She reports that she does not use drugs.  Allergies:  Allergies  Allergen  Reactions  . Savella [Milnacipran Hcl] Other (See Comments)    MOODY ANGRY PALPITATIONS  . Hydromorphone Hcl Itching    No prescriptions prior to admission.    Results for orders placed or performed during the hospital encounter of 05/09/16 (from the past 48 hour(s))  Surgical pcr screen     Status: Abnormal   Collection Time: 05/09/16 11:17 AM  Result Value Ref Range   MRSA, PCR POSITIVE (A) NEGATIVE    Comment: RESULT CALLED TO, READ BACK BY AND VERIFIED WITH: BVira Agar, RN AT 1451 ON [id number] BY S. YARBROUGH    Staphylococcus aureus POSITIVE (A) NEGATIVE    Comment:        The Xpert SA Assay (FDA approved for NASAL specimens in patients over 39 years of age), is one component of a comprehensive surveillance program.  Test performance has been validated by [facility] for patients greater than or equal to 63 year old. It is not intended to diagnose infection nor to guide or monitor treatment.   Basic metabolic panel     Status: None   Collection Time: 05/09/16 11:18 AM  Result Value Ref Range   Sodium 135 135 - 145 mmol/L   Potassium  3.9 3.5 - 5.1 mmol/L   Chloride 105 101 - 111 mmol/L   CO2 25 22 - 32 mmol/L   Glucose, Bld 97 65 - 99 mg/dL   BUN 8 6 - 20 mg/dL   Creatinine, Ser 0.82 0.44 - 1.00 mg/dL   Calcium 9.2 8.9 - 10.3 mg/dL   GFR calc non Af Amer >60 >60 mL/min   GFR calc Af Amer >60 >60 mL/min    Comment: (NOTE) The eGFR has been calculated using the CKD EPI equation. This calculation has not been validated in all clinical situations. eGFR's persistently <60 mL/min signify possible Chronic Kidney Disease.    Anion gap 5 5 - 15  CBC     Status: None   Collection Time: 05/09/16 11:18 AM  Result Value Ref Range   WBC 6.3 4.0 - 10.5 K/uL   RBC 4.73 3.87 - 5.11 MIL/uL   Hemoglobin 14.0 12.0 - 15.0 g/dL   HCT 42.2 36.0 - 46.0 %   MCV 89.2 78.0 - 100.0 fL   MCH 29.6 26.0 - 34.0 pg   MCHC 33.2 30.0 - 36.0 g/dL   RDW 12.3 11.5 - 15.5 %   Platelets 257  150 - 400 K/uL  hCG, serum, qualitative     Status: None   Collection Time: 05/09/16 11:18 AM  Result Value Ref Range   Preg, Serum NEGATIVE NEGATIVE    Comment:        THE SENSITIVITY OF THIS METHODOLOGY IS >10 mIU/mL.    No results found.  Review of Systems  Constitutional: Negative.   HENT: Negative.   Eyes: Negative.   Respiratory: Negative.   Cardiovascular: Negative.   Gastrointestinal: Negative.   Genitourinary: Negative.   Musculoskeletal: Positive for back pain.  Skin: Negative.   Neurological: Positive for sensory change and focal weakness.  Endo/Heme/Allergies: Negative.   Psychiatric/Behavioral: Negative.     Last menstrual period 04/08/2016. Physical Exam hent, nl. Neck, nl. Cv, nl. Lungs, clear. Abdomen, nl. Extremities, nl. NEURO SLR positive at 60 degrees. Weakness of plantiflexion of the left foot. Sensory, nl.   Assessment/Plan Mri lumbar shows a large hnp at l5s1 with compromise bilaterally of both s1 nerve roots. We talked about treatment including fusion. At the end we agree with bilateral laminotomies and discectomy. She is aware of risks and benefits.   Floyce Stakes, MD 05/09/2016, 5:43 PM

## 2016-05-10 ENCOUNTER — Ambulatory Visit (HOSPITAL_COMMUNITY): Payer: Managed Care, Other (non HMO)

## 2016-05-10 ENCOUNTER — Ambulatory Visit (HOSPITAL_COMMUNITY): Payer: Managed Care, Other (non HMO) | Admitting: Anesthesiology

## 2016-05-10 ENCOUNTER — Observation Stay (HOSPITAL_COMMUNITY)
Admission: RE | Admit: 2016-05-10 | Discharge: 2016-05-11 | Disposition: A | Discharge status: Home / Self Care | Payer: Managed Care, Other (non HMO) | Source: Ambulatory Visit | Attending: Neurosurgery | Admitting: Neurosurgery

## 2016-05-10 ENCOUNTER — Encounter (HOSPITAL_COMMUNITY)
Admission: RE | Disposition: A | Discharge status: Home / Self Care | Payer: Self-pay | Source: Ambulatory Visit | Attending: Neurosurgery

## 2016-05-10 ENCOUNTER — Encounter (HOSPITAL_COMMUNITY): Payer: Self-pay | Admitting: *Deleted

## 2016-05-10 DIAGNOSIS — F419 Anxiety disorder, unspecified: Secondary | ICD-10-CM | POA: Insufficient documentation

## 2016-05-10 DIAGNOSIS — R531 Weakness: Secondary | ICD-10-CM | POA: Insufficient documentation

## 2016-05-10 DIAGNOSIS — M5117 Intervertebral disc disorders with radiculopathy, lumbosacral region: Secondary | ICD-10-CM | POA: Diagnosis not present

## 2016-05-10 DIAGNOSIS — G4730 Sleep apnea, unspecified: Secondary | ICD-10-CM | POA: Insufficient documentation

## 2016-05-10 DIAGNOSIS — R262 Difficulty in walking, not elsewhere classified: Secondary | ICD-10-CM | POA: Insufficient documentation

## 2016-05-10 DIAGNOSIS — K219 Gastro-esophageal reflux disease without esophagitis: Secondary | ICD-10-CM | POA: Insufficient documentation

## 2016-05-10 DIAGNOSIS — M797 Fibromyalgia: Secondary | ICD-10-CM | POA: Insufficient documentation

## 2016-05-10 DIAGNOSIS — Z419 Encounter for procedure for purposes other than remedying health state, unspecified: Secondary | ICD-10-CM

## 2016-05-10 DIAGNOSIS — M5126 Other intervertebral disc displacement, lumbar region: Secondary | ICD-10-CM | POA: Diagnosis present

## 2016-05-10 DIAGNOSIS — J45909 Unspecified asthma, uncomplicated: Secondary | ICD-10-CM | POA: Insufficient documentation

## 2016-05-10 HISTORY — PX: LUMBAR LAMINECTOMY/DECOMPRESSION MICRODISCECTOMY: SHX5026

## 2016-05-10 SURGERY — LUMBAR LAMINECTOMY/DECOMPRESSION MICRODISCECTOMY 1 LEVEL
Anesthesia: General | Site: Spine Lumbar | Laterality: Bilateral

## 2016-05-10 MED ORDER — MUPIROCIN 2 % EX OINT
1.0000 "application " | TOPICAL_OINTMENT | Freq: Once | CUTANEOUS | Status: AC
  Administered 2016-05-10: 1 via TOPICAL

## 2016-05-10 MED ORDER — ROCURONIUM BROMIDE 100 MG/10ML IV SOLN
INTRAVENOUS | Status: DC | PRN
  Administered 2016-05-10: 40 mg via INTRAVENOUS

## 2016-05-10 MED ORDER — PROMETHAZINE HCL 25 MG/ML IJ SOLN: 6.2500 mg | INTRAMUSCULAR | Status: DC | PRN

## 2016-05-10 MED ORDER — CYCLOBENZAPRINE HCL 10 MG PO TABS
ORAL_TABLET | ORAL | Status: AC
  Filled 2016-05-10: qty 1

## 2016-05-10 MED ORDER — LORAZEPAM 0.5 MG PO TABS
0.5000 mg | ORAL_TABLET | Freq: Three times a day (TID) | ORAL | Status: DC
  Administered 2016-05-10: 0.5 mg via ORAL
  Filled 2016-05-10 (×2): qty 1

## 2016-05-10 MED ORDER — PROPOFOL 10 MG/ML IV BOLUS
INTRAVENOUS | Status: AC
  Filled 2016-05-10: qty 20

## 2016-05-10 MED ORDER — GLYCOPYRROLATE 0.2 MG/ML IJ SOLN
INTRAMUSCULAR | Status: DC | PRN
  Administered 2016-05-10: 0.4 mg via INTRAVENOUS

## 2016-05-10 MED ORDER — PHENOL 1.4 % MT LIQD: 1.0000 | OROMUCOSAL | Status: DC | PRN

## 2016-05-10 MED ORDER — MORPHINE SULFATE (PF) 2 MG/ML IV SOLN
1.0000 mg | INTRAVENOUS | Status: DC | PRN
  Administered 2016-05-10: 2 mg via INTRAVENOUS
  Administered 2016-05-10: 4 mg via INTRAVENOUS
  Administered 2016-05-10: 2 mg via INTRAVENOUS
  Filled 2016-05-10: qty 1
  Filled 2016-05-10: qty 2
  Filled 2016-05-10 (×2): qty 1

## 2016-05-10 MED ORDER — 0.9 % SODIUM CHLORIDE (POUR BTL) OPTIME
TOPICAL | Status: DC | PRN
  Administered 2016-05-10: 1000 mL

## 2016-05-10 MED ORDER — OXYCODONE HCL 5 MG PO TABS
ORAL_TABLET | ORAL | Status: AC
  Filled 2016-05-10: qty 1

## 2016-05-10 MED ORDER — ACETAMINOPHEN 650 MG RE SUPP: 650.0000 mg | RECTAL | Status: DC | PRN

## 2016-05-10 MED ORDER — ZOLPIDEM TARTRATE 5 MG PO TABS: 5.0000 mg | ORAL_TABLET | Freq: Every evening | ORAL | Status: DC | PRN

## 2016-05-10 MED ORDER — SODIUM CHLORIDE 0.9 % IV SOLN: INTRAVENOUS | Status: DC

## 2016-05-10 MED ORDER — FENTANYL CITRATE (PF) 100 MCG/2ML IJ SOLN
25.0000 ug | INTRAMUSCULAR | Status: DC | PRN
  Administered 2016-05-10 (×3): 50 ug via INTRAVENOUS

## 2016-05-10 MED ORDER — SODIUM CHLORIDE 0.9% FLUSH: 3.0000 mL | Freq: Two times a day (BID) | INTRAVENOUS | Status: DC

## 2016-05-10 MED ORDER — NEOSTIGMINE METHYLSULFATE 10 MG/10ML IV SOLN
INTRAVENOUS | Status: DC | PRN
  Administered 2016-05-10: 3 mg via INTRAVENOUS

## 2016-05-10 MED ORDER — MIDAZOLAM HCL 2 MG/2ML IJ SOLN
INTRAMUSCULAR | Status: AC
  Filled 2016-05-10: qty 2

## 2016-05-10 MED ORDER — MUPIROCIN 2 % EX OINT
1.0000 "application " | TOPICAL_OINTMENT | Freq: Two times a day (BID) | CUTANEOUS | Status: DC
  Administered 2016-05-10 – 2016-05-11 (×2): 1 via NASAL

## 2016-05-10 MED ORDER — VANCOMYCIN HCL 1000 MG IV SOLR
1000.0000 mg | Freq: Two times a day (BID) | INTRAVENOUS | Status: DC
  Administered 2016-05-10: 1000 mg via INTRAVENOUS
  Filled 2016-05-10: qty 1000

## 2016-05-10 MED ORDER — METHYLPREDNISOLONE ACETATE 80 MG/ML IJ SUSP
INTRAMUSCULAR | Status: DC | PRN
  Administered 2016-05-10: 80 mg

## 2016-05-10 MED ORDER — CHLORHEXIDINE GLUCONATE CLOTH 2 % EX PADS: 6.0000 | MEDICATED_PAD | Freq: Every day | CUTANEOUS | Status: DC

## 2016-05-10 MED ORDER — HEMOSTATIC AGENTS (NO CHARGE) OPTIME
TOPICAL | Status: DC | PRN
  Administered 2016-05-10: 1 via TOPICAL

## 2016-05-10 MED ORDER — SODIUM CHLORIDE 0.9 % IV SOLN: 250.0000 mL | INTRAVENOUS | Status: DC

## 2016-05-10 MED ORDER — DEXAMETHASONE SODIUM PHOSPHATE 4 MG/ML IJ SOLN
INTRAMUSCULAR | Status: DC | PRN
Start: 2016-05-10 — End: 2016-05-10
  Administered 2016-05-10: 10 mg via INTRAVENOUS

## 2016-05-10 MED ORDER — ONDANSETRON HCL 4 MG/2ML IJ SOLN
INTRAMUSCULAR | Status: DC | PRN
  Administered 2016-05-10: 4 mg via INTRAVENOUS

## 2016-05-10 MED ORDER — MENTHOL 3 MG MT LOZG: 1.0000 | LOZENGE | OROMUCOSAL | Status: DC | PRN

## 2016-05-10 MED ORDER — THROMBIN 5000 UNITS EX SOLR
CUTANEOUS | Status: DC | PRN
  Administered 2016-05-10 (×2): 5000 [IU] via TOPICAL

## 2016-05-10 MED ORDER — ALBUTEROL SULFATE (2.5 MG/3ML) 0.083% IN NEBU: 3.0000 mL | INHALATION_SOLUTION | Freq: Four times a day (QID) | RESPIRATORY_TRACT | Status: DC | PRN

## 2016-05-10 MED ORDER — BUPIVACAINE LIPOSOME 1.3 % IJ SUSP
INTRAMUSCULAR | Status: DC | PRN
  Administered 2016-05-10: 20 mL

## 2016-05-10 MED ORDER — FENTANYL CITRATE (PF) 100 MCG/2ML IJ SOLN
INTRAMUSCULAR | Status: AC
Start: 2016-05-10 — End: 2016-05-11
  Filled 2016-05-10: qty 2

## 2016-05-10 MED ORDER — FENTANYL CITRATE (PF) 250 MCG/5ML IJ SOLN
INTRAMUSCULAR | Status: DC | PRN
  Administered 2016-05-10: 50 ug via INTRAVENOUS
  Administered 2016-05-10 (×3): 100 ug via INTRAVENOUS

## 2016-05-10 MED ORDER — FENTANYL CITRATE (PF) 100 MCG/2ML IJ SOLN
INTRAMUSCULAR | Status: DC | PRN
  Administered 2016-05-10: 100 ug via INTRAVENOUS

## 2016-05-10 MED ORDER — OXYCODONE HCL 5 MG PO TABS
5.0000 mg | ORAL_TABLET | Freq: Once | ORAL | Status: AC | PRN
  Administered 2016-05-10: 5 mg via ORAL

## 2016-05-10 MED ORDER — OXYCODONE HCL 5 MG/5ML PO SOLN: 5.0000 mg | Freq: Once | ORAL | Status: AC | PRN

## 2016-05-10 MED ORDER — ACETAMINOPHEN 325 MG PO TABS: 650.0000 mg | ORAL_TABLET | ORAL | Status: DC | PRN

## 2016-05-10 MED ORDER — ESCITALOPRAM OXALATE 10 MG PO TABS
10.0000 mg | ORAL_TABLET | Freq: Every day | ORAL | Status: DC
  Administered 2016-05-10: 10 mg via ORAL
  Filled 2016-05-10 (×2): qty 1

## 2016-05-10 MED ORDER — NORETHIN ACE-ETH ESTRAD-FE 1-20 MG-MCG PO TABS
1.0000 | ORAL_TABLET | Freq: Every day | ORAL | Status: DC
  Administered 2016-05-10: 1 via ORAL

## 2016-05-10 MED ORDER — FENTANYL CITRATE (PF) 100 MCG/2ML IJ SOLN
INTRAMUSCULAR | Status: AC
  Filled 2016-05-10: qty 2

## 2016-05-10 MED ORDER — PROPOFOL 10 MG/ML IV BOLUS
INTRAVENOUS | Status: DC | PRN
  Administered 2016-05-10: 150 mg via INTRAVENOUS

## 2016-05-10 MED ORDER — PHENYLEPHRINE HCL 10 MG/ML IJ SOLN
INTRAMUSCULAR | Status: DC | PRN
  Administered 2016-05-10: 80 ug via INTRAVENOUS

## 2016-05-10 MED ORDER — BUPIVACAINE LIPOSOME 1.3 % IJ SUSP
20.0000 mL | Freq: Once | INTRAMUSCULAR | Status: DC
Start: 2016-05-10 — End: 2016-05-11
  Filled 2016-05-10: qty 20

## 2016-05-10 MED ORDER — CEFAZOLIN IN D5W 1 GM/50ML IV SOLN
1.0000 g | Freq: Three times a day (TID) | INTRAVENOUS | Status: AC
  Administered 2016-05-10 – 2016-05-11 (×2): 1 g via INTRAVENOUS
  Filled 2016-05-10 (×2): qty 50

## 2016-05-10 MED ORDER — DOCUSATE SODIUM 100 MG PO CAPS
100.0000 mg | ORAL_CAPSULE | Freq: Every day | ORAL | Status: DC
  Administered 2016-05-10: 100 mg via ORAL
  Filled 2016-05-10: qty 1

## 2016-05-10 MED ORDER — FENTANYL CITRATE (PF) 250 MCG/5ML IJ SOLN
INTRAMUSCULAR | Status: AC
  Filled 2016-05-10: qty 5

## 2016-05-10 MED ORDER — VANCOMYCIN HCL IN DEXTROSE 1-5 GM/200ML-% IV SOLN
INTRAVENOUS | Status: AC
  Filled 2016-05-10: qty 200

## 2016-05-10 MED ORDER — CYCLOBENZAPRINE HCL 10 MG PO TABS
10.0000 mg | ORAL_TABLET | Freq: Three times a day (TID) | ORAL | Status: DC | PRN
  Administered 2016-05-10 (×2): 10 mg via ORAL
  Filled 2016-05-10: qty 1

## 2016-05-10 MED ORDER — OXYCODONE-ACETAMINOPHEN 5-325 MG PO TABS
1.0000 | ORAL_TABLET | ORAL | Status: DC | PRN
  Administered 2016-05-10 – 2016-05-11 (×5): 2 via ORAL
  Filled 2016-05-10 (×5): qty 2

## 2016-05-10 MED ORDER — CEFAZOLIN IN D5W 1 GM/50ML IV SOLN: 1.0000 g | Freq: Three times a day (TID) | INTRAVENOUS | Status: DC

## 2016-05-10 MED ORDER — LACTATED RINGERS IV SOLN
INTRAVENOUS | Status: DC
  Administered 2016-05-10 (×3): via INTRAVENOUS

## 2016-05-10 MED ORDER — EPHEDRINE SULFATE 50 MG/ML IJ SOLN
INTRAMUSCULAR | Status: DC | PRN
  Administered 2016-05-10: 5 mg via INTRAVENOUS

## 2016-05-10 MED ORDER — ONDANSETRON HCL 4 MG/2ML IJ SOLN: 4.0000 mg | INTRAMUSCULAR | Status: DC | PRN

## 2016-05-10 MED ORDER — SODIUM CHLORIDE 0.9% FLUSH: 3.0000 mL | INTRAVENOUS | Status: DC | PRN

## 2016-05-10 MED ORDER — MUPIROCIN 2 % EX OINT
TOPICAL_OINTMENT | CUTANEOUS | Status: AC
Start: 2016-05-10 — End: 2016-05-10
  Administered 2016-05-10: 1 via TOPICAL
  Filled 2016-05-10: qty 22

## 2016-05-10 MED ORDER — MIDAZOLAM HCL 5 MG/5ML IJ SOLN
INTRAMUSCULAR | Status: DC | PRN
  Administered 2016-05-10: 2 mg via INTRAVENOUS

## 2016-05-10 SURGICAL SUPPLY — 60 items
APL SKNCLS STERI-STRIP NONHPOA (GAUZE/BANDAGES/DRESSINGS) ×1
BENZOIN TINCTURE PRP APPL 2/3 (GAUZE/BANDAGES/DRESSINGS) ×2 IMPLANT
BLADE CLIPPER SURG (BLADE) IMPLANT
BUR ACORN 6.0 (BURR) ×2 IMPLANT
BUR MATCHSTICK NEURO 3.0 LAGG (BURR) ×1 IMPLANT
CANISTER SUCT 3000ML PPV (MISCELLANEOUS) ×2 IMPLANT
DRAPE LAPAROTOMY 100X72X124 (DRAPES) ×2 IMPLANT
DRAPE MICROSCOPE LEICA (MISCELLANEOUS) ×2 IMPLANT
DRAPE POUCH INSTRU U-SHP 10X18 (DRAPES) ×2 IMPLANT
DRSG OPSITE POSTOP 4X6 (GAUZE/BANDAGES/DRESSINGS) ×1 IMPLANT
DRSG PAD ABDOMINAL 8X10 ST (GAUZE/BANDAGES/DRESSINGS) IMPLANT
DURAPREP 26ML APPLICATOR (WOUND CARE) ×2 IMPLANT
ELECT BLADE 4.0 EZ CLEAN MEGAD (MISCELLANEOUS) ×2
ELECT REM PT RETURN 9FT ADLT (ELECTROSURGICAL) ×2
ELECTRODE BLDE 4.0 EZ CLN MEGD (MISCELLANEOUS) IMPLANT
ELECTRODE REM PT RTRN 9FT ADLT (ELECTROSURGICAL) ×1 IMPLANT
GAUZE SPONGE 4X4 12PLY STRL (GAUZE/BANDAGES/DRESSINGS) ×1 IMPLANT
GAUZE SPONGE 4X4 16PLY XRAY LF (GAUZE/BANDAGES/DRESSINGS) IMPLANT
GLOVE BIOGEL M 8.0 STRL (GLOVE) ×2 IMPLANT
GLOVE BIOGEL PI IND STRL 7.0 (GLOVE) IMPLANT
GLOVE BIOGEL PI IND STRL 7.5 (GLOVE) IMPLANT
GLOVE BIOGEL PI INDICATOR 7.0 (GLOVE) ×1
GLOVE BIOGEL PI INDICATOR 7.5 (GLOVE) ×2
GLOVE EXAM NITRILE LRG STRL (GLOVE) IMPLANT
GLOVE EXAM NITRILE MD LF STRL (GLOVE) IMPLANT
GLOVE EXAM NITRILE XL STR (GLOVE) IMPLANT
GLOVE EXAM NITRILE XS STR PU (GLOVE) IMPLANT
GLOVE SURG SS PI 7.0 STRL IVOR (GLOVE) ×2 IMPLANT
GOWN STRL REUS W/ TWL LRG LVL3 (GOWN DISPOSABLE) ×1 IMPLANT
GOWN STRL REUS W/ TWL XL LVL3 (GOWN DISPOSABLE) IMPLANT
GOWN STRL REUS W/TWL 2XL LVL3 (GOWN DISPOSABLE) IMPLANT
GOWN STRL REUS W/TWL LRG LVL3 (GOWN DISPOSABLE) ×2
GOWN STRL REUS W/TWL XL LVL3 (GOWN DISPOSABLE) ×4
KIT BASIN OR (CUSTOM PROCEDURE TRAY) ×2 IMPLANT
KIT ROOM TURNOVER OR (KITS) ×2 IMPLANT
NDL HYPO 18GX1.5 BLUNT FILL (NEEDLE) IMPLANT
NDL HYPO 21X1.5 SAFETY (NEEDLE) IMPLANT
NDL HYPO 25X1 1.5 SAFETY (NEEDLE) IMPLANT
NDL SPNL 20GX3.5 QUINCKE YW (NEEDLE) IMPLANT
NEEDLE HYPO 18GX1.5 BLUNT FILL (NEEDLE) IMPLANT
NEEDLE HYPO 21X1.5 SAFETY (NEEDLE) ×4 IMPLANT
NEEDLE HYPO 25X1 1.5 SAFETY (NEEDLE) ×2 IMPLANT
NEEDLE SPNL 20GX3.5 QUINCKE YW (NEEDLE) ×2 IMPLANT
NS IRRIG 1000ML POUR BTL (IV SOLUTION) ×2 IMPLANT
PACK LAMINECTOMY NEURO (CUSTOM PROCEDURE TRAY) ×2 IMPLANT
PAD ARMBOARD 7.5X6 YLW CONV (MISCELLANEOUS) ×10 IMPLANT
PATTIES SURGICAL .5 X1 (DISPOSABLE) ×1 IMPLANT
RUBBERBAND STERILE (MISCELLANEOUS) ×4 IMPLANT
SPONGE LAP 4X18 X RAY DECT (DISPOSABLE) IMPLANT
SPONGE SURGIFOAM ABS GEL SZ50 (HEMOSTASIS) ×2 IMPLANT
STRIP CLOSURE SKIN 1/2X4 (GAUZE/BANDAGES/DRESSINGS) ×2 IMPLANT
SUT VIC AB 0 CT1 18XCR BRD8 (SUTURE) ×1 IMPLANT
SUT VIC AB 0 CT1 8-18 (SUTURE) ×2
SUT VIC AB 2-0 CP2 18 (SUTURE) ×2 IMPLANT
SUT VIC AB 3-0 SH 8-18 (SUTURE) ×2 IMPLANT
SYR 20CC LL (SYRINGE) ×1 IMPLANT
SYR 5ML LL (SYRINGE) IMPLANT
TOWEL OR 17X24 6PK STRL BLUE (TOWEL DISPOSABLE) ×2 IMPLANT
TOWEL OR 17X26 10 PK STRL BLUE (TOWEL DISPOSABLE) ×2 IMPLANT
WATER STERILE IRR 1000ML POUR (IV SOLUTION) ×2 IMPLANT

## 2016-05-10 NOTE — Anesthesia Preprocedure Evaluation (Addendum)
Anesthesia Evaluation  Patient identified by MRN, date of birth, ID band Patient awake    Reviewed: Allergy & Precautions, NPO status , Patient's Chart, lab work & pertinent test results  Airway Mallampati: II  TM Distance: >3 FB Neck ROM: Full    Dental  (+) Dental Advisory Given   Pulmonary asthma , sleep apnea ,    breath sounds clear to auscultation       Cardiovascular negative cardio ROS   Rhythm:Regular Rate:Normal     Neuro/Psych Anxiety negative neurological ROS     GI/Hepatic Neg liver ROS, GERD  ,  Endo/Other  negative endocrine ROS  Renal/GU negative Renal ROS     Musculoskeletal  (+) Fibromyalgia -  Abdominal   Peds  Hematology negative hematology ROS (+)   Anesthesia Other Findings   Reproductive/Obstetrics                            Lab Results  Component Value Date   WBC 6.3 05/09/2016   HGB 14.0 05/09/2016   HCT 42.2 05/09/2016   MCV 89.2 05/09/2016   PLT 257 05/09/2016   Lab Results  Component Value Date   CREATININE 0.82 05/09/2016   BUN 8 05/09/2016   NA 135 05/09/2016   K 3.9 05/09/2016   CL 105 05/09/2016   CO2 25 05/09/2016    Anesthesia Physical Anesthesia Plan  ASA: II  Anesthesia Plan: General   Post-op Pain Management:    Induction: Intravenous  Airway Management Planned: Oral ETT  Additional Equipment:   Intra-op Plan:   Post-operative Plan: Extubation in OR  Informed Consent: I have reviewed the patients History and Physical, chart, labs and discussed the procedure including the risks, benefits and alternatives for the proposed anesthesia with the patient or authorized representative who has indicated his/her understanding and acceptance.   Dental advisory given  Plan Discussed with:   Anesthesia Plan Comments:         Anesthesia Quick Evaluation

## 2016-05-10 NOTE — Evaluation (Signed)
Physical Therapy Evaluation Patient Details Name: Erika Gardner MRN: [id number] DOB: [birth year] Today's Date: 05/10/2016   History of Present Illness  patient is a 40 yo female s/p Bilateral L5-S1 laminotomy, foraminotomy, bilateral.  Clinical Impression  Patient seen for mobility assessment and education s/p spinal surgery. Educated on spinal precuations, car transfers, mobility expectations and pain management. Patient receptive. Patient mobilizing well, performed stair negotiation without difficulty. No further acute PT needs. Will sign off.      Follow Up Recommendations No PT follow up    Equipment Recommendations  None recommended by PT    Recommendations for Other Services       Precautions / Restrictions Precautions Precautions: Back Precaution Booklet Issued: Yes (comment) Precaution Comments: reviewed verbally with patient Restrictions Weight Bearing Restrictions: No      Mobility  Bed Mobility Overal bed mobility: Needs Assistance Bed Mobility: Rolling;Sidelying to Sit;Sit to Sidelying Rolling: Supervision Sidelying to sit: Supervision     Sit to sidelying: Supervision General bed mobility comments: Supervision for inital cues otherwise independent with no physical assist required.  Transfers Overall transfer level: Modified independent Equipment used: None             General transfer comment: increased time to perform  Ambulation/Gait Ambulation/Gait assistance: Modified independent (Device/Increase time) Ambulation Distance (Feet): 310 Feet Assistive device: None Gait Pattern/deviations: Step-through pattern;Decreased stride length;Drifts right/left;Narrow base of support Gait velocity: decreased Gait velocity interpretation: Below normal speed for age/gender General Gait Details: slow and guarded gait, modest instability noted but able to self correct. increased time for ambulation  Stairs Stairs: Yes Stairs assistance:  Supervision Stair Management: One rail Right Number of Stairs: 6 General stair comments: no difficulty with steps, educated on seqeucning  Wheelchair Mobility    Modified Rankin (Stroke Patients Only)       Balance Overall balance assessment: No apparent balance deficits (not formally assessed)                                           Pertinent Vitals/Pain Pain Assessment: 0-10 Pain Score: 6  Pain Location: back Pain Descriptors / Indicators: Sore Pain Intervention(s): Monitored during session    Home Living Family/patient expects to be discharged to:: Private residence Living Arrangements: Spouse/significant other Available Help at Discharge: Family Type of Home: House Home Access: Level entry     Home Layout: Two level;Bed/bath upstairs Home Equipment: None      Prior Function Level of Independence: Independent               Hand Dominance   Dominant Hand: Right    Extremity/Trunk Assessment   Upper Extremity Assessment: Overall WFL for tasks assessed           Lower Extremity Assessment: LLE deficits/detail         Communication   Communication: No difficulties  Cognition Arousal/Alertness: Awake/alert Behavior During Therapy: WFL for tasks assessed/performed Overall Cognitive Status: Within Functional Limits for tasks assessed                      General Comments      Exercises        Assessment/Plan    PT Assessment Patent does not need any further PT services  PT Diagnosis Difficulty walking;Acute pain   PT Problem List    PT Treatment Interventions     PT Goals (Current  goals can be found in the Care Plan section) Acute Rehab PT Goals PT Goal Formulation: All assessment and education complete, DC therapy    Frequency     Barriers to discharge        Co-evaluation               End of Session Equipment Utilized During Treatment: Gait belt Activity Tolerance: Patient tolerated  treatment well Patient left: in bed;with call bell/phone within reach Nurse Communication: Mobility status    Functional Assessment Tool Used: clinical judgement Functional Limitation: Mobility: Walking and moving around Mobility: Walking and Moving Around Current Status (W0981): At least 1 percent but less than 20 percent impaired, limited or restricted Mobility: Walking and Moving Around Goal Status [phone]): At least 1 percent but less than 20 percent impaired, limited or restricted Mobility: Walking and Moving Around Discharge Status [phone]): At least 1 percent but less than 20 percent impaired, limited or restricted    Time: 1559-1616 PT Time Calculation (min) (ACUTE ONLY): 17 min   Charges:   PT Evaluation $PT Eval Low Complexity: 1 Procedure     PT G Codes:   PT G-Codes **NOT FOR INPATIENT CLASS** Functional Assessment Tool Used: clinical judgement Functional Limitation: Mobility: Walking and moving around Mobility: Walking and Moving Around Current Status (O1308): At least 1 percent but less than 20 percent impaired, limited or restricted Mobility: Walking and Moving Around Goal Status [phone]): At least 1 percent but less than 20 percent impaired, limited or restricted Mobility: Walking and Moving Around Discharge Status [phone]): At least 1 percent but less than 20 percent impaired, limited or restricted    Fabio Asa 05/10/2016, 5:49 PM  Charlotte Crumb, PT DPT  [phone]

## 2016-05-10 NOTE — Progress Notes (Signed)
Pt transferred to 5C02 due to low census. Pt is stable @ transfer and report was called to Bed Bath & BeyondKelly, Charity fundraiserN. Rema FendtAshley Sotirios Navarro, RN

## 2016-05-10 NOTE — Progress Notes (Signed)
Report given to ronda hnt rn as cvaregiver

## 2016-05-10 NOTE — Anesthesia Procedure Notes (Signed)
Procedure Name: Intubation Date/Time: 05/10/2016 11:25 AM Performed by: Marena ChancyBECKNER, Ibtisam Benge S Pre-anesthesia Checklist: Emergency Drugs available, Patient identified, Suction available, Patient being monitored and Timeout performed Patient Re-evaluated:Patient Re-evaluated prior to inductionOxygen Delivery Method: Circle system utilized Preoxygenation: Pre-oxygenation with 100% oxygen Intubation Type: IV induction Ventilation: Mask ventilation without difficulty Laryngoscope Size: Miller and 2 Grade View: Grade II Tube type: Oral Tube size: 7.5 mm Number of attempts: 1 Placement Confirmation: ETT inserted through vocal cords under direct vision,  positive ETCO2 and breath sounds checked- equal and bilateral Tube secured with: Tape Dental Injury: Teeth and Oropharynx as per pre-operative assessment

## 2016-05-10 NOTE — Transfer of Care (Signed)
Immediate Anesthesia Transfer of Care Note  Patient: Erika Gardner  Procedure(s) Performed: Procedure(s) with comments: Bilateral Lumbar five-sacral one Microdiskectomy (Bilateral) - bilateral  Patient Location: PACU  Anesthesia Type:General  Level of Consciousness: awake, alert  and oriented  Airway & Oxygen Therapy: Patient Spontanous Breathing and Patient connected to nasal cannula oxygen  Post-op Assessment: Report given to RN, Post -op Vital signs reviewed and stable and Patient moving all extremities X 4  Post vital signs: Reviewed and stable  Last Vitals:  Vitals:   05/10/16 1300 05/10/16 1315  BP: 109/62 121/82  Pulse: 93 78  Resp: (!) 24 (!) 21  Temp: 36.2 C     Last Pain:  Vitals:   05/10/16 1300  TempSrc:   PainSc: 10-Worst pain ever         Complications: No apparent anesthesia complications

## 2016-05-10 NOTE — Op Note (Signed)
NAMTyson Gardner:  Erika Gardner, Erika Gardner           ACCOUNT NO.:  [id number]  MEDICAL RECORD NO.:  [id number]  LOCATION:  [facility]                         FACILITY:  [facility]  PHYSICIAN:  Hilda LiasErnesto Debara Kamphuis, M.D.   DATE OF BIRTH:  04-Jul-1976  DATE OF PROCEDURE: DATE OF DISCHARGE:                              OPERATIVE REPORT   PREOPERATIVE DIAGNOSES:  Bilateral L5-S1 herniated disk with radiculopathy, chronic.  Degenerative disk disease.  POSTOPERATIVE DIAGNOSES:  Bilateral L5-S1 herniated disk with radiculopathy, chronic.  Degenerative disk disease.  PROCEDURES:  Bilateral L5-S1 laminotomy, foraminotomy, bilateral diskectomy, decompression of the L5-S1 nerve root as well as the thecal sac.  Microscope.  SURGEON:  Hilda LiasErnesto Joran Kallal, M.D.  ASSISTANT:  Dr. Conchita ParisNundkumar.  CLINICAL HISTORY:  Erika Gardner is a lady who I have known from little while.  Her husband is a patient of mine.  She had been complaining of back pain radiation to both legs.  She has failed with conservative treatment.  The pain is bilaterally slightly worse in the right side than the left side.  The MRI showed central herniated disk with compromise of both the S1 nerve root.  Surgery was advised.  The patient knew about the risk including the possibility down the line, she may require lumbar fusion.  DESCRIPTION OF PROCEDURE:  The patient was taken to the OR, and after intubation, she was positioned in a prone manner.  The back was cleaned with DuraPrep and drapes were applied.  X-rays showed that we were right at the level of L5-S1.  Midline incision was made through the skin, subcutaneous tissue straight down to the fascia.  Muscles were retracted bilaterally.  We identified the L5-S1 space.  With the help of the microscope, we drilled the lower lamina of L5 and the upper of S1.  The yellow ligament also was excised.  We started first on the right side where we found that there was almost no space for the S1 nerve root. Mobilization  was made.  We retracted it medially and we found that there was a herniated disk with some component going to the body of S1. Incision was made, although, the disk was small.  The patient had quite a bit of degenerative disk disease.  The same procedure was found in the left side with more disk at this level than the right side.  Incision was made and at the end, we have bilateral diskectomy with decompression of the L5-S1 nerve root as well as the thecal sac.  Valsalva maneuver was negative.  At the end, the area was irrigated.  Fentanyl and Depo-Medrol were left in the operative site and the wound was closed with Vicryl and Steri-Strip.          ______________________________ Hilda LiasErnesto Tomoki Lucken, M.D.     EB/MEDQ  D:  05/10/2016  T:  05/10/2016  Job:  [id number]

## 2016-05-11 DIAGNOSIS — M5117 Intervertebral disc disorders with radiculopathy, lumbosacral region: Secondary | ICD-10-CM | POA: Diagnosis not present

## 2016-05-11 MED ORDER — DIAZEPAM 5 MG PO TABS: 5.0000 mg | ORAL_TABLET | Freq: Four times a day (QID) | ORAL | 0 refills | Status: DC | PRN

## 2016-05-11 MED ORDER — OXYCODONE-ACETAMINOPHEN 5-325 MG PO TABS: 1.0000 | ORAL_TABLET | ORAL | 0 refills | Status: DC | PRN

## 2016-05-11 NOTE — Discharge Instructions (Signed)
Lumbar Diskectomy, Care After Refer to this sheet in the next few weeks. These instructions provide you with information about caring for yourself after your procedure. Your health care provider may also give you more specific instructions. Your treatment has been planned according to current medical practices, but problems sometimes occur. Call your health care provider if you have any problems or questions after your procedure. WHAT TO EXPECT AFTER THE PROCEDURE After your procedure, it is common to have:  Pain.  Numbness.  Weakness. HOME CARE INSTRUCTIONS Medicines  Take medicines only as directed by your health care provider.  If you were prescribed an antibiotic medicine, finish all of it even if you start to feel better. Incision Care  There are many different ways to close and cover an incision, including stitches (sutures), skin glue, and adhesive strips. Follow your health care provider's instructions about:  Incision care.  Bandage (dressing) changes and removal.  Incision closure removal.  Check your incision area every day for signs of infection. If you cannot see your incision, have someone check it for you. Watch for:  Redness, swelling, or pain.  Fluid, blood, or pus. Activities  Avoid sitting for longer than 20 minutes at a time or as directed by your health care provider.  Do not climb stairs more than once each day until your health care provider approves.  Do not bend at your waist. To pick things up, bend your knees.  Do not lift anything that is heavier than 10 lb (4.5 kg) or as directed by your health care provider.  Do not drive a car until your health care provider approves.  Ask your health care provider when you may return to your normal activities, such as playing sports and going back to work.  Work with your physical therapist to learn safe movement and exercises to help healing. Do these exercises as directed.  Take short walks  often. General Instructions  Do not use any tobacco products, including cigarettes, chewing tobacco, or electronic cigarettes. If you need help quitting, ask your health care provider.  Follow your health care provider's instructions about bathing. Do not take baths, shower, swim, or use a hot tub until your health care provider approves.  Wear your back brace as directed by your health care provider.  To prevent constipation:  Drink enough fluid to keep your urine clear or pale yellow.  Eat plenty of fruits, vegetables, and whole grains.  Keep all follow-up visits as directed by your health care provider. This is important. This includes any follow-up visits with your physical therapist. SEEK MEDICAL CARE IF:  You have a fever.  You have redness, swelling, or pain in your incision area.  Your pain is not controlled with medicine.  You have pain, numbness, or weakness that lasts longer than three weeks after surgery.  You become constipated. SEEK IMMEDIATE MEDICAL CARE IF:  You have fluid, blood, or pus coming from your incision.  You have increasing pain, numbness, or weakness.  You lose control of when you urinate or have a bowel movement (incontinence).  You have chest pain.  You have trouble breathing.   This information is not intended to replace advice given to you by your health care provider. Make sure you discuss any questions you have with your health care provider.   Document Released: 08/21/2004 Document Revised: 10/07/2014 Document Reviewed: 05/11/2014 Elsevier Interactive Patient Education [employer]2016 Elsevier Inc.

## 2016-05-11 NOTE — Progress Notes (Signed)
Pt d/c to home by car with family. Assessment stable. Prescriptions given. All questions answered. 

## 2016-05-11 NOTE — Discharge Summary (Signed)
Physician Discharge Summary  Patient ID: Erika DenseRegina Gardner MRN: [id number] DOB/AGE: [birth year] 40 y.o.  Admit date: 05/10/2016 Discharge date: 05/11/2016  Admission Diagnoses:Herniated nucleus pulposus L5-S1  Discharge Diagnoses: Ernie aided nucleus pulposus L5-S1 Active Problems:   Lumbar herniated disc   Discharged Condition: good  Hospital Course: Patient tolerated surgery well is ambulatory  Consults: None  Significant Diagnostic Studies: None  Treatments: surgery: Bilateral discectomy L5-S1  Discharge Exam: Blood pressure 104/70, pulse 87, temperature 98.8 F (37.1 C), temperature source Oral, resp. rate 16, height 5\' 5"  (1.651 m), weight 71.7 kg (158 lb), last menstrual period 04/08/2016, SpO2 98 %. Incision is clean and dry motor function is intact patient reports numbness in the lateral aspect of the left foot  Disposition: 01-Home or Self Care  Discharge Instructions    Call MD for:  redness, tenderness, or signs of infection (pain, swelling, redness, odor or green/yellow discharge around incision site)    Complete by:  As directed   Call MD for:  severe uncontrolled pain    Complete by:  As directed   Call MD for:  temperature >100.4    Complete by:  As directed   Diet - low sodium heart healthy    Complete by:  As directed   Increase activity slowly    Complete by:  As directed       Medication List    TAKE these medications   albuterol 108 (90 Base) MCG/ACT inhaler Commonly known as:  PROVENTIL HFA;VENTOLIN HFA Inhale 2 puffs into the lungs every 6 (six) hours as needed for wheezing or shortness of breath.   diazepam 5 MG tablet Commonly known as:  VALIUM Take 1 tablet (5 mg total) by mouth every 6 (six) hours as needed for muscle spasms.   docusate sodium 100 MG capsule Commonly known as:  COLACE Take 100 mg by mouth at bedtime.   escitalopram 10 MG tablet Commonly known as:  LEXAPRO Take 1 tablet (10 mg total) by mouth daily. What changed:  when  to take this   HYDROcodone-acetaminophen 5-325 MG tablet Commonly known as:  NORCO/VICODIN Take 1 tablet by mouth every 6 (six) hours as needed for moderate pain.   ibuprofen 200 MG tablet Commonly known as:  ADVIL,MOTRIN Take 600 mg by mouth every 6 (six) hours as needed.   LORazepam 0.5 MG tablet Commonly known as:  ATIVAN Take 0.5 mg by mouth every 8 (eight) hours.   norethindrone-ethinyl estradiol 1-20 MG-MCG tablet Commonly known as:  JUNEL FE,GILDESS FE,LOESTRIN FE Take 1 tablet by mouth at bedtime.   oxyCODONE-acetaminophen 5-325 MG tablet Commonly known as:  PERCOCET/ROXICET Take 1-2 tablets by mouth every 4 (four) hours as needed for moderate pain.   traMADol 50 MG tablet Commonly known as:  ULTRAM Take 50 mg by mouth every 6 (six) hours as needed for moderate pain.   vitamin B-12 1000 MCG tablet Commonly known as:  CYANOCOBALAMIN Take 1 tablet (1,000 mcg total) by mouth daily.        SignedStefani Dama: Darrelle Wiberg J 05/11/2016, 9:17 AM

## 2016-05-11 NOTE — Evaluation (Signed)
Occupational Therapy Evaluation and Discharge Patient Details Name: Erika Gardner MRN: [id number] DOB: 1976-08-10 Today's Date: 05/11/2016    History of Present Illness patient is a 40 yo female s/p Bilateral L5-S1 laminotomy, foraminotomy, bilateral.   Clinical Impression   Pt reports she was independent with ADL PTA. Currently pt overall supervision for safety with ADL and functional mobility. All back, safety, and ADL education completed with pt. Pt verbalized 3/3 back precautions and was able to maintain throughout all activities during session. Pt planning to d/c home with 24/7 supervision from family. No further acute OT needs identified; signing off at this time. Please re-consult if needs change. Thank you for this referral.     Follow Up Recommendations  No OT follow up;Supervision - Intermittent    Equipment Recommendations  None recommended by OT    Recommendations for Other Services       Precautions / Restrictions Precautions Precautions: Back Precaution Booklet Issued: No Precaution Comments: Pt able to recall 3/3 back precautions. Restrictions Weight Bearing Restrictions: No      Mobility Bed Mobility Overal bed mobility: Modified Independent Bed Mobility: Rolling;Sidelying to Sit;Sit to Sidelying Rolling: Modified independent (Device/Increase time) Sidelying to sit: Modified independent (Device/Increase time)     Sit to sidelying: Modified independent (Device/Increase time) General bed mobility comments: HOB flat, use of bed rails. Good technique.  Transfers Overall transfer level: Modified independent Equipment used: None             General transfer comment: Increased time. No unsteadiness or LOB noted.    Balance Overall balance assessment: No apparent balance deficits (not formally assessed)                                          ADL Overall ADL's : Needs assistance/impaired Eating/Feeding: Independent;Sitting    Grooming: Supervision/safety;Standing Grooming Details (indicate cue type and reason): Educated pt on use of 2 cups for oral care. Upper Body Bathing: Supervision/ safety;Standing   Lower Body Bathing: Supervison/ safety   Upper Body Dressing : Set up;Sitting   Lower Body Dressing: Supervision/safety;Sit to/from stand Lower Body Dressing Details (indicate cue type and reason): Pt able to cross foot over opposite knee. Educated on compensatory strategies for LB ADL. Toilet Transfer: Supervision/safety;Ambulation;Regular Toilet;Grab bars   Toileting- Clothing Manipulation and Hygiene: Supervision/safety;Sit to/from stand   Tub/ Shower Transfer: Supervision/safety;Walk-in shower;Ambulation   Functional mobility during ADLs: Supervision/safety General ADL Comments: Educated pt on maintaining back precautions during functional activities, keeping frequently used items at counter top height, log roll for bed mobility, frequent movement at home.     Vision Vision Assessment?: No apparent visual deficits   Perception     Praxis      Pertinent Vitals/Pain Pain Assessment: Faces Faces Pain Scale: Hurts little more Pain Location: back Pain Descriptors / Indicators: Sore Pain Intervention(s): Monitored during session     Hand Dominance Right   Extremity/Trunk Assessment Upper Extremity Assessment Upper Extremity Assessment: Overall WFL for tasks assessed   Lower Extremity Assessment Lower Extremity Assessment: Defer to PT evaluation   Cervical / Trunk Assessment Cervical / Trunk Assessment: Other exceptions Cervical / Trunk Exceptions: s/p lumbar sx   Communication Communication Communication: No difficulties   Cognition Arousal/Alertness: Awake/alert Behavior During Therapy: WFL for tasks assessed/performed Overall Cognitive Status: Within Functional Limits for tasks assessed  General Comments       Exercises       Shoulder Instructions       Home Living Family/patient expects to be discharged to:: Private residence Living Arrangements: Spouse/significant other Available Help at Discharge: Family;Available 24 hours/day Type of Home: House Home Access: Level entry     Home Layout: Two level;Bed/bath upstairs Alternate Level Stairs-Number of Steps: flight Alternate Level Stairs-Rails: Right Bathroom Shower/Tub: Producer, television/film/videoWalk-in shower   Bathroom Toilet: Standard     Home Equipment: Shower seat - built in          Prior Functioning/Environment Level of Independence: Independent             OT Diagnosis: Generalized weakness;Acute pain   OT Problem List:     OT Treatment/Interventions:      OT Goals(Current goals can be found in the care plan section) Acute Rehab OT Goals Patient Stated Goal: go home OT Goal Formulation: All assessment and education complete, DC therapy  OT Frequency:     Barriers to D/C:            Co-evaluation              End of Session Nurse Communication: Mobility status  Activity Tolerance: Patient tolerated treatment well Patient left: in bed;with call bell/phone within reach   Time: 0803-0813 OT Time Calculation (min): 10 min Charges:  OT General Charges $OT Visit: 1 Procedure OT Evaluation $OT Eval Low Complexity: 1 Procedure G-Codes: OT G-codes **NOT FOR INPATIENT CLASS** Functional Assessment Tool Used: Clinical judgement Functional Limitation: Self care Self Care Current Status ([id number](G8987): At least 1 percent but less than 20 percent impaired, limited or restricted Self Care Goal Status ([id number](G8988): At least 1 percent but less than 20 percent impaired, limited or restricted Self Care Discharge Status [phone]([id number]): At least 1 percent but less than 20 percent impaired, limited or restricted   Gaye AlkenBailey A Diala Waxman M.S., OTR/L Pager: [phone]  05/11/2016, 8:22 AM

## 2016-05-13 ENCOUNTER — Encounter (HOSPITAL_COMMUNITY): Payer: Self-pay | Admitting: Neurosurgery

## 2016-05-13 NOTE — Anesthesia Postprocedure Evaluation (Signed)
Anesthesia Post Note  Patient: Erika Gardner  Procedure(s) Performed: Procedure(s) (LRB): Bilateral Lumbar five-sacral one Microdiskectomy (Bilateral)  Patient location during evaluation: PACU Anesthesia Type: General Level of consciousness: awake and alert Pain management: pain level controlled Vital Signs Assessment: post-procedure vital signs reviewed and stable Respiratory status: spontaneous breathing, nonlabored ventilation, respiratory function stable and patient connected to nasal cannula oxygen Cardiovascular status: blood pressure returned to baseline and stable Postop Assessment: no signs of nausea or vomiting Anesthetic complications: no    Last Vitals:  Vitals:   05/11/16 0208 05/11/16 0604  BP: 107/65 104/70  Pulse: 72 87  Resp: 16 16  Temp: 36.7 C 37.1 C    Last Pain:  Vitals:   05/11/16 0802  TempSrc:   PainSc: 4                  Kennieth RadFitzgerald, Judieth Mckown E

## 2016-06-19 ENCOUNTER — Other Ambulatory Visit (HOSPITAL_COMMUNITY): Payer: Self-pay | Admitting: Neurosurgery

## 2016-06-19 ENCOUNTER — Ambulatory Visit (HOSPITAL_COMMUNITY)
Admission: RE | Admit: 2016-06-19 | Discharge: 2016-06-19 | Disposition: A | Discharge status: Home / Self Care | Payer: Managed Care, Other (non HMO) | Source: Ambulatory Visit | Attending: Neurosurgery | Admitting: Neurosurgery

## 2016-06-19 DIAGNOSIS — M5416 Radiculopathy, lumbar region: Secondary | ICD-10-CM

## 2016-06-19 DIAGNOSIS — M8938 Hypertrophy of bone, other site: Secondary | ICD-10-CM | POA: Diagnosis not present

## 2016-06-19 MED ORDER — GADOBENATE DIMEGLUMINE 529 MG/ML IV SOLN
14.0000 mL | Freq: Once | INTRAVENOUS | Status: AC | PRN
  Administered 2016-06-19: 14 mL via INTRAVENOUS

## 2016-07-30 ENCOUNTER — Encounter: Payer: Self-pay | Admitting: Family Medicine

## 2016-07-30 ENCOUNTER — Ambulatory Visit (INDEPENDENT_AMBULATORY_CARE_PROVIDER_SITE_OTHER): Payer: Managed Care, Other (non HMO) | Admitting: Family Medicine

## 2016-07-30 VITALS — BP 109/77 | HR 84 | Temp 98.5°F | Resp 20 | Wt 162.0 lb

## 2016-07-30 DIAGNOSIS — Z130 Encounter for screening for diseases of the blood and blood-forming organs and certain disorders involving the immune mechanism: Secondary | ICD-10-CM

## 2016-07-30 DIAGNOSIS — Z79899 Other long term (current) drug therapy: Secondary | ICD-10-CM

## 2016-07-30 DIAGNOSIS — Z8639 Personal history of other endocrine, nutritional and metabolic disease: Secondary | ICD-10-CM

## 2016-07-30 DIAGNOSIS — Z6826 Body mass index (BMI) 26.0-26.9, adult: Secondary | ICD-10-CM | POA: Diagnosis not present

## 2016-07-30 DIAGNOSIS — E559 Vitamin D deficiency, unspecified: Secondary | ICD-10-CM | POA: Diagnosis not present

## 2016-07-30 DIAGNOSIS — F411 Generalized anxiety disorder: Secondary | ICD-10-CM | POA: Diagnosis not present

## 2016-07-30 DIAGNOSIS — Z131 Encounter for screening for diabetes mellitus: Secondary | ICD-10-CM

## 2016-07-30 DIAGNOSIS — Z13220 Encounter for screening for lipoid disorders: Secondary | ICD-10-CM

## 2016-07-30 MED ORDER — ESCITALOPRAM OXALATE 10 MG PO TABS: 10.0000 mg | ORAL_TABLET | Freq: Every day | ORAL | 0 refills | Status: DC

## 2016-07-30 NOTE — Patient Instructions (Signed)
Great to see you you today... You look so cute in your costume.  See at the beginning of the year for physical with fasting labs a few days prior.

## 2016-07-30 NOTE — Progress Notes (Signed)
Patient ID: Erika Gardner, female   DOB: 1976/08/15, 40 y.o.   MRN: [id number]   Subjective:    Patient ID: Erika Gardner, female    DOB: 1976/08/15, 40 y.o.   MRN: [id number]  HPI Patient presents for follow-up on anxiety today.  Anxiety: patient is no longer needing the ativan medication. She has continued to take the Lexapro 10 mg and is doing well. She is considering trying the 5 mg lower dose, but is uncertain at this time if that is what she wants to do.   Past Medical History:  Diagnosis Date  . Abnormal mammogram of right breast 09/2012   stable hypoechoic lesion  . Anxiety   . Asthma    somewhat sounding like exercise induced relative to temperature   . B12 deficiency   . Carpal tunnel syndrome    both wrists  . Complication of anesthesia    pt. reports that the combination of morphine & / or dilaudid with anesthesia  presents with itching    . Fibromyalgia   . GERD (gastroesophageal reflux disease)   . Insomnia   . Neuromuscular disorder (HCC)   . Panic attacks   . Seizures (HCC)    x2 with a syncopal episode (03/2015- last episode)  . Sleep apnea    not needing treatment, through a sleep study done showing that if she is on her back she has slight sleep apnea.    . Snoring   . Thyroid disease    multinodular goiter  . Vertigo   . Vitamin D deficiency    Allergies  Allergen Reactions  . Savella [Milnacipran Hcl] Other (See Comments)    MOODY ANGRY PALPITATIONS  . Hydromorphone Hcl Itching    Review of Systems Negative, with the exception of above mentioned in HPI     Objective:   Physical Exam BP 109/77 (BP Location: Right Arm, Patient Position: Sitting, Cuff Size: Normal)   Pulse 84   Temp 98.5 F (36.9 C)   Resp 20   Wt 162 lb (73.5 kg)   SpO2 98%   BMI 26.96 kg/m  Gen: Afebrile. No acute distress. Nontoxic in appearance, well-developed, well-nourished, Caucasian female. Appears well. HENT: AT. [place].MMM.  Psych: Normal affect, dress and  demeanor. Normal speech. Normal thought content and judgment.    Assessment & Plan:  Erika Gardner is 40 y.o. female Generalized anxiety disorder - refill on lexapro today - she may try to decrease dose to 5 mg for a trial.  -  follow up 3-6 months for CPE  - Future lab orders placed for CPE to occur 1-11/2016: Lipid panel, a1c, tsh, cbc, cmp, vit d, b12  Electronically Signed by: Felix Pacinienee Alexi Geibel, DO [facility]- OR

## 2016-08-26 ENCOUNTER — Telehealth: Payer: Self-pay

## 2016-08-26 ENCOUNTER — Other Ambulatory Visit: Payer: Self-pay | Admitting: Family Medicine

## 2016-08-26 MED ORDER — OMEPRAZOLE 20 MG PO CPDR: 20.0000 mg | DELAYED_RELEASE_CAPSULE | Freq: Every day | ORAL | 2 refills | Status: DC

## 2016-08-26 NOTE — Telephone Encounter (Signed)
Pharmacy sent request for Omeprazole 20 mg, last filled 08/10/15. Medication not on med list.

## 2016-08-26 NOTE — Progress Notes (Signed)
Refilled prilosec fr pt. She had not needed this medicine for awhile. If she continues to have problems after restarting medicine she should be seen.

## 2016-09-09 ENCOUNTER — Telehealth: Payer: Self-pay | Admitting: Family Medicine

## 2016-09-09 NOTE — Telephone Encounter (Signed)
Patient calling to report her GYN has been prescribing her Spironolactone 50mg  for acne.  Since she only sees her GYN once a year, she would like to know if Dr. Claiborne BillingsKuneff would be willing to prescribe this medication for her instead.  [facility]

## 2016-09-09 NOTE — Telephone Encounter (Signed)
Please call pt: - she is asking if we will take over management of her Acne medication (spirolactone), that was managed by GYN. - I do not mind taking over management, however she will need to be seen for the problem and followed routinely for this issue in ordered to prescribe medication for her.

## 2016-09-09 NOTE — Telephone Encounter (Signed)
Left message on patient voice mail with information and instructions. 

## 2016-09-10 ENCOUNTER — Encounter: Payer: Self-pay | Admitting: Family Medicine

## 2016-09-10 ENCOUNTER — Ambulatory Visit (INDEPENDENT_AMBULATORY_CARE_PROVIDER_SITE_OTHER): Payer: Managed Care, Other (non HMO) | Admitting: Family Medicine

## 2016-09-10 VITALS — BP 108/76 | HR 62 | Temp 97.6°F | Resp 20 | Wt 155.5 lb

## 2016-09-10 DIAGNOSIS — K219 Gastro-esophageal reflux disease without esophagitis: Secondary | ICD-10-CM | POA: Diagnosis not present

## 2016-09-10 DIAGNOSIS — Z79899 Other long term (current) drug therapy: Secondary | ICD-10-CM | POA: Diagnosis not present

## 2016-09-10 DIAGNOSIS — R1013 Epigastric pain: Secondary | ICD-10-CM

## 2016-09-10 DIAGNOSIS — L700 Acne vulgaris: Secondary | ICD-10-CM | POA: Diagnosis not present

## 2016-09-10 LAB — LIPASE: Lipase: 27 U/L (ref 11.0–59.0)

## 2016-09-10 MED ORDER — SUCRALFATE 1 G PO TABS: 1.0000 g | ORAL_TABLET | Freq: Three times a day (TID) | ORAL | 0 refills | Status: DC

## 2016-09-10 MED ORDER — SPIRONOLACTONE 100 MG PO TABS: 100.0000 mg | ORAL_TABLET | Freq: Every day | ORAL | 1 refills | Status: DC

## 2016-09-10 MED ORDER — OMEPRAZOLE 20 MG PO CPDR: 20.0000 mg | DELAYED_RELEASE_CAPSULE | Freq: Every day | ORAL | 2 refills | Status: DC

## 2016-09-10 NOTE — Progress Notes (Signed)
Erika DenseRegina Fister , [birth year], 40 y.o., female MRN: [id number] Patient Care Team    Relationship Specialty Notifications Start End  Natalia Leatherwoodenee A Jassmine Vandruff, DO PCP - General Family Medicine  06/01/15     CC: 2 new complaints. Subjective:   RUQ pain: pt presents for RUQ pain for 4 days duration. She reports the pain is constant sharp and feels consistent to when she was having gallbladder issues. She has had a cholecystectomy, appendectomy, umbilical hernia repair. Pt has had multiple ABD complaints, with many different types of imaging over the last few years. She has a h/o of GERD and currently is prescribed omeprazole 20 mg QD. She denies fever, chills, nausea, vomit or diarrhea. Her last BM was this morning and normal. She endorses 7-8/10 pain, that is worse with sitting upright and moving. She has tried nothing for the pain, because she is unable to tolerate NSAIDS with her GERD. She has had a decreased appetite, but tolerating PO. She denies lifting, increase in activity, or dietary changes. She did endorse a few beers on Saturday prior to onset. Prior images do not show fatty liver changes or pancreas changes. Pain is not worsened or related to food or type of food. She endorses pain RUQ and and just below sternum.   Acne: Pt has been treated through gynecology for her acne. She is desiring to come here for her treatments. She has never been seen by this provider for this issue. She reports switching her BCP to Junel and starting spirolactone 50 mg QD about 1 month ago. She has seen an improvement since the start of medications, however she is still having cystic acne around chin and perioral area. She denies other problem areas. She does not use a face care product or acne topical creams.   Allergies  Allergen Reactions  . Savella [Milnacipran Hcl] Other (See Comments)    MOODY ANGRY PALPITATIONS  . Hydromorphone Hcl Itching   Social History  Substance Use Topics  . Smoking status: Never  Smoker  . Smokeless tobacco: Never Used  . Alcohol use Yes     Comment: socially- occasional- 4-5 x in a month     Past Medical History:  Diagnosis Date  . Abnormal mammogram of right breast 09/2012   stable hypoechoic lesion  . Anxiety   . Asthma    somewhat sounding like exercise induced relative to temperature   . B12 deficiency   . Carpal tunnel syndrome    both wrists  . Complication of anesthesia    pt. reports that the combination of morphine & / or dilaudid with anesthesia  presents with itching    . Fibromyalgia   . GERD (gastroesophageal reflux disease)   . Insomnia   . Neuromuscular disorder (HCC)   . Panic attacks   . Seizures (HCC)    x2 with a syncopal episode (03/2015- last episode)  . Sleep apnea    not needing treatment, through a sleep study done showing that if she is on her back she has slight sleep apnea.    . Snoring   . Thyroid disease    multinodular goiter  . Vertigo   . Vitamin D deficiency    Past Surgical History:  Procedure Laterality Date  . APPENDECTOMY    . CHOLECYSTECTOMY    . DILATION AND CURETTAGE OF UTERUS  2007   Post miscarriage  . HERNIA REPAIR     umbilicus  . KNEE ARTHROSCOPY Right 2014   wear and tear  .  LUMBAR LAMINECTOMY/DECOMPRESSION MICRODISCECTOMY Bilateral 05/10/2016   Procedure: Bilateral Lumbar five-sacral one Microdiskectomy;  Surgeon: Hilda LiasErnesto Botero, MD;  Location: [facility];  Service: Neurosurgery;  Laterality: Bilateral;  bilateral  . VAGINAL DELIVERY     x3   Family History  Problem Relation Age of Onset  . Arthritis Maternal Grandmother   . Hearing loss Maternal Grandmother   . Cancer Maternal Grandfather     skin  . Heart disease Maternal Grandfather   . Arthritis Paternal Grandmother   . Cancer Paternal Grandfather     kidney  . Heart disease Paternal Grandfather   . Kidney disease Paternal Grandfather      Medication List       Accurate as of 09/10/16  9:57 AM. Always use your most recent med  list.          albuterol 108 (90 Base) MCG/ACT inhaler Commonly known as:  PROVENTIL HFA;VENTOLIN HFA Inhale 2 puffs into the lungs every 6 (six) hours as needed for wheezing or shortness of breath.   escitalopram 10 MG tablet Commonly known as:  LEXAPRO Take 1 tablet (10 mg total) by mouth at bedtime.   norethindrone-ethinyl estradiol 1-20 MG-MCG tablet Commonly known as:  JUNEL FE,GILDESS FE,LOESTRIN FE Take 1 tablet by mouth at bedtime.   omeprazole 20 MG capsule Commonly known as:  PRILOSEC Take 1 capsule (20 mg total) by mouth daily.   spironolactone 50 MG tablet Commonly known as:  ALDACTONE TAKE 1 TABLET(S) EVERY DAY BY ORAL ROUTE.   vitamin B-12 1000 MCG tablet Commonly known as:  CYANOCOBALAMIN Take 1 tablet (1,000 mcg total) by mouth daily.   Vitamin D3 1000 units Caps Take 3,000 Units by mouth.       No results found for this or any previous visit (from the past 24 hour(s)). No results found.   ROS: Negative, with the exception of above mentioned in HPI   Objective:  BP 108/76 (BP Location: Left Arm, Patient Position: Sitting, Cuff Size: Normal)   Pulse 62   Temp 97.6 F (36.4 C)   Resp 20   Wt 155 lb 8 oz (70.5 kg)   SpO2 98%   BMI 25.88 kg/m  Body mass index is 25.88 kg/m. Gen: Afebrile. No acute distress. Nontoxic in appearance, well developed, well nourished.  HENT: AT. [place].MMM Eyes:Pupils Equal Round Reactive to light, Extraocular movements intact,  Conjunctiva without redness, discharge or icterus. CV: RRR   Abd: Soft. flat. Mild epigastric and RUQ pain TTP. ND. BS present. no Masses palpated. No rebound or guarding. Skin: no rashes, purpura or petechiae. ~5 cystic scar/hyperpigmentation over chin.  Neuro: Normal gait. PERLA. EOMi. Alert. Oriented x3   Assessment/Plan: Erika Gardner is a 40 y.o. female present for acute OV for two new problems Acne vulgaris Encounter for long-term current use of medication - new - reports  improvement since starting medication.  - discussed daily washing of face.  - refill on spirolactone, increased to 100 mg QD.  - CMP to make certain potasium is normal.  - consider epiduo if needs additional coverage.  - every 6 months as long as doing well.   Epigastric pain/GERD - new - COMPLETE METABOLIC PANEL WITH GFR - Lipase - treat as GERD/ulcer, increased PPI  Today and started Carafate.  - GERD diet.  - F/U 4- 8 weeks.     electronically signed by:  Felix Pacinienee Zacary Bauer, DO   [facility] - [place]

## 2016-09-10 NOTE — Patient Instructions (Addendum)
Increase spirolactone to 100 mg daily daily.  We will check you potassium today.  Give this regimen about 1 month, if still needing additional help could call in and I will call in a topical cream for you as well.    Follow every 6 months as long as doing well.   Take omeprazole 40 mg and carafate daily.  I think your pain is from an ulcer, your labs will tell us more.

## 2016-09-11 ENCOUNTER — Telehealth: Payer: Self-pay | Admitting: Family Medicine

## 2016-09-11 ENCOUNTER — Encounter: Payer: Self-pay | Admitting: Family Medicine

## 2016-09-11 ENCOUNTER — Ambulatory Visit: Payer: Managed Care, Other (non HMO) | Admitting: Family Medicine

## 2016-09-11 LAB — COMPLETE METABOLIC PANEL WITH GFR
ALBUMIN: 4.1 g/dL (ref 3.6–5.1)
ALK PHOS: 57 U/L (ref 33–115)
ALT: 20 U/L (ref 6–29)
AST: 20 U/L (ref 10–30)
BILIRUBIN TOTAL: 0.3 mg/dL (ref 0.2–1.2)
BUN: 12 mg/dL (ref 7–25)
CO2: 26 mmol/L (ref 20–31)
Calcium: 9.2 mg/dL (ref 8.6–10.2)
Chloride: 105 mmol/L (ref 98–110)
Creat: 0.8 mg/dL (ref 0.50–1.10)
GFR, Est African American: >89 mL/min (ref >=60)
GFR, Est Non African American: >89 mL/min (ref >=60)
GLUCOSE: 103 mg/dL — AB (ref 65–99)
Potassium: 4.3 mmol/L (ref 3.5–5.3)
SODIUM: 139 mmol/L (ref 135–146)
TOTAL PROTEIN: 7.1 g/dL (ref 6.1–8.1)

## 2016-09-11 NOTE — Telephone Encounter (Signed)
Left detailed message with results and instructions on patient voice mail per DPR 

## 2016-09-11 NOTE — Telephone Encounter (Signed)
Please call pt: - her labs are normal.  I suspect her discomfort is from her stomach. Take the increased dose of omeprazole and carafate. Watch her diet and try to avoid spicy/acidic meals/drinks for a few weeks.  Follow up on this issue in 4 weeks, sooner if worsening pain.

## 2016-09-12 ENCOUNTER — Encounter: Payer: Self-pay | Admitting: Family Medicine

## 2016-09-12 ENCOUNTER — Ambulatory Visit (INDEPENDENT_AMBULATORY_CARE_PROVIDER_SITE_OTHER): Payer: Managed Care, Other (non HMO) | Admitting: Family Medicine

## 2016-09-12 VITALS — BP 107/75 | HR 75 | Temp 98.1°F | Resp 20 | Wt 156.0 lb

## 2016-09-12 DIAGNOSIS — K219 Gastro-esophageal reflux disease without esophagitis: Secondary | ICD-10-CM

## 2016-09-12 DIAGNOSIS — R1013 Epigastric pain: Secondary | ICD-10-CM | POA: Diagnosis not present

## 2016-09-12 MED ORDER — DEXLANSOPRAZOLE 30 MG PO CPDR: 30.0000 mg | DELAYED_RELEASE_CAPSULE | Freq: Every day | ORAL | 0 refills | Status: DC

## 2016-09-12 MED ORDER — RANITIDINE HCL 150 MG PO TABS: 150.0000 mg | ORAL_TABLET | Freq: Two times a day (BID) | ORAL | 1 refills | Status: DC

## 2016-09-12 NOTE — Progress Notes (Signed)
Tyson DenseRegina Laconte , 02/10/1976, 40 y.o., female MRN: [id number] Patient Care Team    Relationship Specialty Notifications Start End  Natalia Leatherwoodenee A Kuneff, DO PCP - General Family Medicine  06/01/15     Subjective:   RUQ pain/epigastric pain/GERD:  Patient was seen 2 days ago for increased epigastric pain. She has increased her omeprazole to 40 mg and taking the Carafate as directed, but reports the pain is worsening and she is now endorsing sore throat and heartburn. She still endorses mild nausea and decreased appetite. She is an established pt with digestive health specialists for this issue around march 2016.  She endorses increased stress, but not to the degree she felt should cause her to have any symptoms. She states she continues to watch her diet, but it does not seem to matter what type of food or drink she consumes. There is no certain time of the day pain is worse, or associated with empty stomach or meals.   Prior note:   pt presents for RUQ pain for 4 days duration. She reports the pain is constant sharp and feels consistent to when she was having gallbladder issues. She has had a cholecystectomy, appendectomy, umbilical hernia repair. Pt has had multiple ABD complaints, with many different types of imaging over the last few years. She has a h/o of GERD and currently is prescribed omeprazole 20 mg QD. She denies fever, chills, nausea, vomit or diarrhea. Her last BM was this morning and normal. She endorses 7-8/10 pain, that is worse with sitting upright and moving. She has tried nothing for the pain, because she is unable to tolerate NSAIDS with her GERD. She has had a decreased appetite, but tolerating PO. She denies lifting, increase in activity, or dietary changes. She did endorse a few beers on Saturday prior to onset. Prior images do not show fatty liver changes or pancreas changes. Pain is not worsened or related to food or type of food. She endorses pain RUQ and and just below sternum.       Allergies  Allergen Reactions  . Savella [Milnacipran Hcl] Other (See Comments)    MOODY ANGRY PALPITATIONS  . Hydromorphone Hcl Itching   Social History  Substance Use Topics  . Smoking status: Never Smoker  . Smokeless tobacco: Never Used  . Alcohol use Yes     Comment: socially- occasional- 4-5 x in a month     Past Medical History:  Diagnosis Date  . Abnormal mammogram of right breast 09/2012   stable hypoechoic lesion  . Anxiety   . Asthma    somewhat sounding like exercise induced relative to temperature   . B12 deficiency   . Carpal tunnel syndrome    both wrists  . Complication of anesthesia    pt. reports that the combination of morphine & / or dilaudid with anesthesia  presents with itching    . Fibromyalgia   . GERD (gastroesophageal reflux disease)   . Insomnia   . Neuromuscular disorder (HCC)   . Panic attacks   . Seizures (HCC)    x2 with a syncopal episode (03/2015- last episode)  . Sleep apnea    not needing treatment, through a sleep study done showing that if she is on her back she has slight sleep apnea.    . Snoring   . Thyroid disease    multinodular goiter  . Vertigo   . Vitamin D deficiency    Past Surgical History:  Procedure Laterality Date  .  APPENDECTOMY    . CHOLECYSTECTOMY  2014  . DILATION AND CURETTAGE OF UTERUS  2007   Post miscarriage  . HERNIA REPAIR     umbilicus  . KNEE ARTHROSCOPY Right 2014   wear and tear  . LUMBAR LAMINECTOMY/DECOMPRESSION MICRODISCECTOMY Bilateral 05/10/2016   Procedure: Bilateral Lumbar five-sacral one Microdiskectomy;  Surgeon: Hilda LiasErnesto Botero, MD;  Location: [facility];  Service: Neurosurgery;  Laterality: Bilateral;  bilateral  . VAGINAL DELIVERY     x3   Family History  Problem Relation Age of Onset  . Arthritis Maternal Grandmother   . Hearing loss Maternal Grandmother   . Cancer Maternal Grandfather     skin  . Heart disease Maternal Grandfather   . Arthritis Paternal Grandmother    . Cancer Paternal Grandfather     kidney  . Heart disease Paternal Grandfather   . Kidney disease Paternal Grandfather      Medication List       Accurate as of 09/12/16  9:14 AM. Always use your most recent med list.          albuterol 108 (90 Base) MCG/ACT inhaler Commonly known as:  PROVENTIL HFA;VENTOLIN HFA Inhale 2 puffs into the lungs every 6 (six) hours as needed for wheezing or shortness of breath.   escitalopram 10 MG tablet Commonly known as:  LEXAPRO Take 1 tablet (10 mg total) by mouth at bedtime.   norethindrone-ethinyl estradiol 1-20 MG-MCG tablet Commonly known as:  JUNEL FE,GILDESS FE,LOESTRIN FE Take 1 tablet by mouth at bedtime.   omeprazole 20 MG capsule Commonly known as:  PRILOSEC Take 1 capsule (20 mg total) by mouth daily.   spironolactone 100 MG tablet Commonly known as:  ALDACTONE Take 1 tablet (100 mg total) by mouth daily.   sucralfate 1 g tablet Commonly known as:  CARAFATE Take 1 tablet (1 g total) by mouth 4 (four) times daily -  with meals and at bedtime.   vitamin B-12 1000 MCG tablet Commonly known as:  CYANOCOBALAMIN Take 1 tablet (1,000 mcg total) by mouth daily.   Vitamin D3 1000 units Caps Take 3,000 Units by mouth.       No results found for this or any previous visit (from the past 24 hour(s)). No results found.   ROS: Negative, with the exception of above mentioned in HPI   Objective:  BP 107/75 (BP Location: Right Arm, Patient Position: Sitting, Cuff Size: Normal)   Pulse 75   Temp 98.1 F (36.7 C)   Resp 20   Wt 156 lb (70.8 kg)   SpO2 98%   BMI 25.96 kg/m  Body mass index is 25.96 kg/m.  Gen: Afebrile. No acute distress.  HENT: AT. [place]. MMM.  Eyes:Pupils Equal Round Reactive to light, Extraocular movements intact,  Conjunctiva without redness, discharge or icterus. Abd: Soft. TTP Midline inferior to sternum ND. BS present. No Masses palpated. No rebound tenderness or guarding.  Neuro: Normal gait.  PERLA. EOMi. Alert. Oriented.  Assessment/Plan: Tyson DenseRegina Mofield is a 40 y.o. female present for worsening epigastric  Epigastric pain/GERD - Continue PPI, carafate. Added dexilant 30 and zantac.  - GI referral, pt is established with digestive health specialist.  - GERD diet.  - F/U 4 weeks, unless she is seen with GI prior.   electronically signed by:  Felix Pacinienee Kuneff, DO  [facility] [place]

## 2016-09-12 NOTE — Patient Instructions (Addendum)
I have called in the dexilant 30 mg daily and zantac 150 mg every 12 hours.  Continue PPI and carafate.  I would like you to try to call your GI and see if they can see you quickly, I will also put in a referral just in case.  I fell you will likely need an EGD of your stomach.     Peptic Ulcer A peptic ulcer is a painful sore in the lining of your esophagus, stomach, or in the first part of your small intestine. The main causes of an ulcer can be:  An infection.  Using certain pain medicines too often or too much.  Smoking. HOME CARE  Avoid smoking, alcohol, and caffeine.  Avoid foods that bother you.  Only take medicine as told by your doctor. Do not take any medicines your doctor has not approved.  Keep all doctor visits as told. GET HELP IF:  You do not get better in 7 days after starting treatment.  You keep having an upset stomach (indigestion) or heartburn. GET HELP RIGHT AWAY IF:  You have sudden, sharp, or lasting belly (abdominal) pain.  You have bloody, black, or tarry poop (stool).  You throw up (vomit) blood or your throw up looks like coffee grounds.  You get light-headed, weak, or feel like you will pass out (faint).  You get sweaty or feel sticky and cold to the touch (clammy). MAKE SURE YOU:   Understand these instructions.  Will watch your condition.  Will get help right away if you are not doing well or get worse. This information is not intended to replace advice given to you by your health care provider. Make sure you discuss any questions you have with your health care provider. Document Released: 12/11/2009 Document Revised: 10/07/2014 Document Reviewed: 06/17/2015 Elsevier Interactive Patient Education  2017 [employer]

## 2016-10-14 ENCOUNTER — Other Ambulatory Visit: Payer: Managed Care, Other (non HMO)

## 2016-10-15 ENCOUNTER — Other Ambulatory Visit: Payer: Self-pay | Admitting: *Deleted

## 2016-10-15 DIAGNOSIS — K219 Gastro-esophageal reflux disease without esophagitis: Secondary | ICD-10-CM

## 2016-10-15 DIAGNOSIS — R1013 Epigastric pain: Secondary | ICD-10-CM

## 2016-10-15 MED ORDER — OMEPRAZOLE 20 MG PO CPDR: 40.0000 mg | DELAYED_RELEASE_CAPSULE | Freq: Every day | ORAL | 0 refills | Status: DC

## 2016-10-15 MED ORDER — SUCRALFATE 1 G PO TABS: 1.0000 g | ORAL_TABLET | Freq: Three times a day (TID) | ORAL | 0 refills | Status: DC

## 2016-10-15 NOTE — Telephone Encounter (Signed)
Sent refill on prilosec and carafate for 30 day supply. Patient needs to schedule appt with GI prior to anymore refills. Left message for patient on voice mail.

## 2016-10-16 ENCOUNTER — Encounter: Payer: Managed Care, Other (non HMO) | Admitting: Family Medicine

## 2016-10-17 ENCOUNTER — Other Ambulatory Visit: Payer: Managed Care, Other (non HMO)

## 2016-10-21 ENCOUNTER — Ambulatory Visit (INDEPENDENT_AMBULATORY_CARE_PROVIDER_SITE_OTHER): Payer: Managed Care, Other (non HMO) | Admitting: Family Medicine

## 2016-10-21 ENCOUNTER — Encounter: Payer: Self-pay | Admitting: Family Medicine

## 2016-10-21 VITALS — BP 106/75 | HR 71 | Temp 98.4°F | Resp 20 | Ht 65.0 in | Wt 157.5 lb

## 2016-10-21 DIAGNOSIS — Z6826 Body mass index (BMI) 26.0-26.9, adult: Secondary | ICD-10-CM | POA: Diagnosis not present

## 2016-10-21 DIAGNOSIS — Z8639 Personal history of other endocrine, nutritional and metabolic disease: Secondary | ICD-10-CM | POA: Diagnosis not present

## 2016-10-21 DIAGNOSIS — Z13220 Encounter for screening for lipoid disorders: Secondary | ICD-10-CM | POA: Diagnosis not present

## 2016-10-21 DIAGNOSIS — E042 Nontoxic multinodular goiter: Secondary | ICD-10-CM | POA: Diagnosis not present

## 2016-10-21 DIAGNOSIS — Z0001 Encounter for general adult medical examination with abnormal findings: Secondary | ICD-10-CM | POA: Diagnosis not present

## 2016-10-21 DIAGNOSIS — E538 Deficiency of other specified B group vitamins: Secondary | ICD-10-CM

## 2016-10-21 DIAGNOSIS — Z131 Encounter for screening for diabetes mellitus: Secondary | ICD-10-CM | POA: Diagnosis not present

## 2016-10-21 DIAGNOSIS — E559 Vitamin D deficiency, unspecified: Secondary | ICD-10-CM

## 2016-10-21 DIAGNOSIS — R5383 Other fatigue: Secondary | ICD-10-CM

## 2016-10-21 LAB — IRON AND TIBC
%SAT: 19 % (ref 11–50)
Iron: 84 ug/dL (ref 40–190)
TIBC: 435 ug/dL (ref 250–450)
UIBC: 351 ug/dL (ref 125–400)

## 2016-10-21 LAB — CBC WITH DIFFERENTIAL/PLATELET
BASOS PCT: 0.5 % (ref 0.0–3.0)
Basophils Absolute: 0 10*3/uL (ref 0.0–0.1)
Eosinophils Absolute: 0.1 10*3/uL (ref 0.0–0.7)
Eosinophils Relative: 0.8 % (ref 0.0–5.0)
HCT: 40 % (ref 36.0–46.0)
Hemoglobin: 13.6 g/dL (ref 12.0–15.0)
LYMPHS ABS: 1.7 10*3/uL (ref 0.7–4.0)
Lymphocytes Relative: 24.6 % (ref 12.0–46.0)
MCHC: 33.9 g/dL (ref 30.0–36.0)
MCV: 87 fl (ref 78.0–100.0)
MONO ABS: 0.5 10*3/uL (ref 0.1–1.0)
Monocytes Relative: 7.4 % (ref 3.0–12.0)
NEUTROS ABS: 4.7 10*3/uL (ref 1.4–7.7)
Neutrophils Relative %: 66.7 % (ref 43.0–77.0)
PLATELETS: 287 10*3/uL (ref 150.0–400.0)
RBC: 4.59 Mil/uL (ref 3.87–5.11)
RDW: 12.8 % (ref 11.5–15.5)
WBC: 7 10*3/uL (ref 4.0–10.5)

## 2016-10-21 LAB — VITAMIN B12: Vitamin B-12: 739 pg/mL (ref 211–911)

## 2016-10-21 LAB — LIPID PANEL
CHOL/HDL RATIO: 2
Cholesterol: 187 mg/dL (ref 0–200)
HDL: 82.4 mg/dL (ref >39.00)
LDL CALC: 90 mg/dL (ref 0–99)
NONHDL: 104.6
Triglycerides: 73 mg/dL (ref 0.0–149.0)
VLDL: 14.6 mg/dL (ref 0.0–40.0)

## 2016-10-21 LAB — HEMOGLOBIN A1C: HEMOGLOBIN A1C: 5.5 % (ref 4.6–6.5)

## 2016-10-21 LAB — TSH: TSH: 0.88 u[IU]/mL (ref 0.35–4.50)

## 2016-10-21 MED ORDER — ESCITALOPRAM OXALATE 10 MG PO TABS: 10.0000 mg | ORAL_TABLET | Freq: Every day | ORAL | 0 refills | Status: DC

## 2016-10-21 NOTE — Patient Instructions (Signed)
It was nice to see you today. I will call you with labs once available.   Health Maintenance, Female Introduction Adopting a healthy lifestyle and getting preventive care can go a long way to promote health and wellness. Talk with your health care provider about what schedule of regular examinations is right for you. This is a good chance for you to check in with your provider about disease prevention and staying healthy. In between checkups, there are plenty of things you can do on your own. Experts have done a lot of research about which lifestyle changes and preventive measures are most likely to keep you healthy. Ask your health care provider for more information. Weight and diet Eat a healthy diet  Be sure to include plenty of vegetables, fruits, low-fat dairy products, and lean protein.  Do not eat a lot of foods high in solid fats, added sugars, or salt.  Get regular exercise. This is one of the most important things you can do for your health.  Most adults should exercise for at least 150 minutes each week. The exercise should increase your heart rate and make you sweat (moderate-intensity exercise).  Most adults should also do strengthening exercises at least twice a week. This is in addition to the moderate-intensity exercise. Maintain a healthy weight  Body mass index (BMI) is a measurement that can be used to identify possible weight problems. It estimates body fat based on height and weight. Your health care provider can help determine your BMI and help you achieve or maintain a healthy weight.  For females 51 years of age and older:  A BMI below 18.5 is considered underweight.  A BMI of 18.5 to 24.9 is normal.  A BMI of 25 to 29.9 is considered overweight.  A BMI of 30 and above is considered obese. Watch levels of cholesterol and blood lipids  You should start having your blood tested for lipids and cholesterol at 40 years of age, then have this test every 5  years.  You may need to have your cholesterol levels checked more often if:  Your lipid or cholesterol levels are high.  You are older than 40 years of age.  You are at high risk for heart disease. Cancer screening Lung Cancer  Lung cancer screening is recommended for adults 65-66 years old who are at high risk for lung cancer because of a history of smoking.  A yearly low-dose CT scan of the lungs is recommended for people who:  Currently smoke.  Have quit within the past 15 years.  Have at least a 30-pack-year history of smoking. A pack year is smoking an average of one pack of cigarettes a day for 1 year.  Yearly screening should continue until it has been 15 years since you quit.  Yearly screening should stop if you develop a health problem that would prevent you from having lung cancer treatment. Breast Cancer  Practice breast self-awareness. This means understanding how your breasts normally appear and feel.  It also means doing regular breast self-exams. Let your health care provider know about any changes, no matter how small.  If you are in your 20s or 30s, you should have a clinical breast exam (CBE) by a health care provider every 1-3 years as part of a regular health exam.  If you are 32 or older, have a CBE every year. Also consider having a breast X-ray (mammogram) every year.  If you have a family history of breast cancer, talk to  your health care provider about genetic screening.  If you are at high risk for breast cancer, talk to your health care provider about having an MRI and a mammogram every year.  Breast cancer gene (BRCA) assessment is recommended for women who have family members with BRCA-related cancers. BRCA-related cancers include:  Breast.  Ovarian.  Tubal.  Peritoneal cancers.  Results of the assessment will determine the need for genetic counseling and BRCA1 and BRCA2 testing. Cervical Cancer  Your health care provider may recommend  that you be screened regularly for cancer of the pelvic organs (ovaries, uterus, and vagina). This screening involves a pelvic examination, including checking for microscopic changes to the surface of your cervix (Pap test). You may be encouraged to have this screening done every 3 years, beginning at age 68.  For women ages 47-65, health care providers may recommend pelvic exams and Pap testing every 3 years, or they may recommend the Pap and pelvic exam, combined with testing for human papilloma virus (HPV), every 5 years. Some types of HPV increase your risk of cervical cancer. Testing for HPV may also be done on women of any age with unclear Pap test results.  Other health care providers may not recommend any screening for nonpregnant women who are considered low risk for pelvic cancer and who do not have symptoms. Ask your health care provider if a screening pelvic exam is right for you.  If you have had past treatment for cervical cancer or a condition that could lead to cancer, you need Pap tests and screening for cancer for at least 20 years after your treatment. If Pap tests have been discontinued, your risk factors (such as having a new sexual partner) need to be reassessed to determine if screening should resume. Some women have medical problems that increase the chance of getting cervical cancer. In these cases, your health care provider may recommend more frequent screening and Pap tests. Colorectal Cancer  This type of cancer can be detected and often prevented.  Routine colorectal cancer screening usually begins at 40 years of age and continues through 40 years of age.  Your health care provider may recommend screening at an earlier age if you have risk factors for colon cancer.  Your health care provider may also recommend using home test kits to check for hidden blood in the stool.  A small camera at the end of a tube can be used to examine your colon directly (sigmoidoscopy or  colonoscopy). This is done to check for the earliest forms of colorectal cancer.  Routine screening usually begins at age 24.  Direct examination of the colon should be repeated every 5-10 years through 40 years of age. However, you may need to be screened more often if early forms of precancerous polyps or small growths are found. Skin Cancer  Check your skin from head to toe regularly.  Tell your health care provider about any new moles or changes in moles, especially if there is a change in a mole's shape or color.  Also tell your health care provider if you have a mole that is larger than the size of a pencil eraser.  Always use sunscreen. Apply sunscreen liberally and repeatedly throughout the day.  Protect yourself by wearing long sleeves, pants, a wide-brimmed hat, and sunglasses whenever you are outside. Heart disease, diabetes, and high blood pressure  High blood pressure causes heart disease and increases the risk of stroke. High blood pressure is more likely to develop in:  People who have blood pressure in the high end of the normal range (130-139/85-89 mm Hg).  People who are overweight or obese.  People who are African American.  If you are 60-50 years of age, have your blood pressure checked every 3-5 years. If you are 33 years of age or older, have your blood pressure checked every year. You should have your blood pressure measured twice-once when you are at a hospital or clinic, and once when you are not at a hospital or clinic. Record the average of the two measurements. To check your blood pressure when you are not at a hospital or clinic, you can use:  An automated blood pressure machine at a pharmacy.  A home blood pressure monitor.  If you are between 27 years and 42 years old, ask your health care provider if you should take aspirin to prevent strokes.  Have regular diabetes screenings. This involves taking a blood sample to check your fasting blood sugar  level.  If you are at a normal weight and have a low risk for diabetes, have this test once every three years after 40 years of age.  If you are overweight and have a high risk for diabetes, consider being tested at a younger age or more often. Preventing infection Hepatitis B  If you have a higher risk for hepatitis B, you should be screened for this virus. You are considered at high risk for hepatitis B if:  You were born in a country where hepatitis B is common. Ask your health care provider which countries are considered high risk.  Your parents were born in a high-risk country, and you have not been immunized against hepatitis B (hepatitis B vaccine).  You have HIV or AIDS.  You use needles to inject street drugs.  You live with someone who has hepatitis B.  You have had sex with someone who has hepatitis B.  You get hemodialysis treatment.  You take certain medicines for conditions, including cancer, organ transplantation, and autoimmune conditions. Hepatitis C  Blood testing is recommended for:  Everyone born from [birth year] through 1964.  Anyone with known risk factors for hepatitis C. Sexually transmitted infections (STIs)  You should be screened for sexually transmitted infections (STIs) including gonorrhea and chlamydia if:  You are sexually active and are younger than 40 years of age.  You are older than 40 years of age and your health care provider tells you that you are at risk for this type of infection.  Your sexual activity has changed since you were last screened and you are at an increased risk for chlamydia or gonorrhea. Ask your health care provider if you are at risk.  If you do not have HIV, but are at risk, it may be recommended that you take a prescription medicine daily to prevent HIV infection. This is called pre-exposure prophylaxis (PrEP). You are considered at risk if:  You are sexually active and do not regularly use condoms or know the HIV status  of your partner(s).  You take drugs by injection.  You are sexually active with a partner who has HIV. Talk with your health care provider about whether you are at high risk of being infected with HIV. If you choose to begin PrEP, you should first be tested for HIV. You should then be tested every 3 months for as long as you are taking PrEP. Pregnancy  If you are premenopausal and you may become pregnant, ask your health care provider about  preconception counseling.  If you may become pregnant, take 400 to 800 micrograms (mcg) of folic acid every day.  If you want to prevent pregnancy, talk to your health care provider about birth control (contraception). Osteoporosis and menopause  Osteoporosis is a disease in which the bones lose minerals and strength with aging. This can result in serious bone fractures. Your risk for osteoporosis can be identified using a bone density scan.  If you are 30 years of age or older, or if you are at risk for osteoporosis and fractures, ask your health care provider if you should be screened.  Ask your health care provider whether you should take a calcium or vitamin D supplement to lower your risk for osteoporosis.  Menopause may have certain physical symptoms and risks.  Hormone replacement therapy may reduce some of these symptoms and risks. Talk to your health care provider about whether hormone replacement therapy is right for you. Follow these instructions at home:  Schedule regular health, dental, and eye exams.  Stay current with your immunizations.  Do not use any tobacco products including cigarettes, chewing tobacco, or electronic cigarettes.  If you are pregnant, do not drink alcohol.  If you are breastfeeding, limit how much and how often you drink alcohol.  Limit alcohol intake to no more than 1 drink per day for nonpregnant women. One drink equals 12 ounces of beer, 5 ounces of wine, or 1 ounces of hard liquor.  Do not use street  drugs.  Do not share needles.  Ask your health care provider for help if you need support or information about quitting drugs.  Tell your health care provider if you often feel depressed.  Tell your health care provider if you have ever been abused or do not feel safe at home. This information is not intended to replace advice given to you by your health care provider. Make sure you discuss any questions you have with your health care provider. Document Released: 04/01/2011 Document Revised: 02/22/2016 Document Reviewed: 06/20/2015  2017 [employer]  Please help [place] help you:  It is a privilege to be able to take care of great patients such as yourself. We are honored you have chosen [facility] for your Primary Care home. Below you will find basic instructions that you may need to access in the future. Please help [place] help you by reading the instructions, which cover many of the frequent questions we experience.   Prescription refills and request:  -In order to allow more efficient response time, please call your pharmacy for all refills. They will forward the request electronically to [place]. This allows for the quickest possible response. Request left on a nurse line can take longer to refill, since these are checked as time allows between office patients and other phone calls.  - refill request can take up to 3-5 working days to complete.  - If request is sent electronically and request is appropiate, it is usually completed in 1-2 business days.  - all patients will need to be seen routinely for all chronic medical conditions requiring prescription medications (see follow-up below). If you are overdue for follow up on your condition, you will be asked to make an appointment and we will call in enough medication to cover you until your appointment (up to 30 days).  - all controlled substances will require a face to face visit to request/refill.  - if you desire your prescriptions to go  through a new pharmacy, and have an  active script at original pharmacy, you will need to call your pharmacy and have scripts transferred to new pharmacy. This is completed between the pharmacy locations and not by your provider.    Results: If any images or labs were ordered, it can take up to 1 week to get results depending on the test ordered and the lab/facility running and resulting the test. - Normal or stable results, which do not need further discussion, will be released to your mychart immediately with attached note to you. A call will not be generated for normal results. Please make certain to sign up for mychart. If you have questions on how to activate your mychart you can call the front office.  - If your results need further discussion, our office will attempt to contact you via phone, and if unable to reach you after 2 attempts, we will release your abnormal result to your mychart with instructions.  - All results will be automatically released in mychart after 1 week.  - Your provider will provide you with explanation and instruction on all relevant material in your results. Please keep in mind, results and labs may appear confusing or abnormal to the untrained eye, but it does not mean they are actually abnormal for you personally. If you have any questions about your results that are not covered, or you desire more detailed explanation than what was provided, you should make an appointment with your provider to do so.   Our office handles many outgoing and incoming calls daily. If we have not contacted you within 1 week about your results, please check your mychart to see if there is a message first and if not, then contact our office.  In helping with this matter, you help decrease call volume, and therefore allow [place] to be able to respond to patients needs more efficiently.   Acute office visits (sick visit):  An acute visit is intended for a new problem and are scheduled in shorter  time slots to allow schedule openings for patients with new problems. This is the appropriate visit to discuss a new problem. In order to provide you with excellent quality medical care with proper time for you to explain your problem, have an exam and receive treatment with instructions, these appointments should be limited to one new problem per visit. If you experience a new problem, in which you desire to be addressed, please make an acute office visit, we save openings on the schedule to accommodate you. Please do not save your new problem for any other type of visit, let us take care of it properly and quickly for you.   Follow up visits:  Depending on your condition(s) your provider will need to see you routinely in order to provide you with quality care and prescribe medication(s). Most chronic conditions (Example: hypertension, Diabetes, depression/anxiety... etc), require visits a couple times a year. Your provider will instruct you on proper follow up for your personal medical conditions and history. Please make certain to make follow up appointments for your condition as instructed. Failing to do so could result in lapse in your medication treatment/refills. If you request a refill, and are overdue to be seen on a condition, we will always provide you with a 30 day script (once) to allow you time to schedule.    Medicare wellness (well visit): - we have a wonderful Nurse Maudie Mercury), that will meet with you and provide you will yearly medicare wellness visits. These visits should occur yearly (can  not be scheduled less than 1 calendar year apart) and cover preventive health, immunizations, advance directives and screenings you are entitled to yearly through your medicare benefits. Do not miss out on your entitled benefits, this is when medicare will pay for these benefits to be ordered for you.  These are strongly encouraged by your provider and is the appropriate type of visit to make certain you are  up to date with all preventive health benefits. If you have not had your medicare wellness exam in the last 12 months, please make certain to schedule one by calling the office and schedule your medicare wellness with Maudie Mercury as soon as possible.   Yearly physical (well visit):  - Adults are recommended to be seen yearly for physicals. Check with your insurance and date of your last physical, most insurances require one calendar year between physicals. Physicals include all preventive health topics, screenings, medical exam and labs that are appropriate for gender/age and history. You may have fasting labs needed at this visit. This is a well visit (not a sick visit), acute topics should not be covered during this visit.  - Pediatric patients are seen more frequently when they are younger. Your provider will advise you on well child visit timing that is appropriate for your their age. - This is not a medicare wellness visit. Medicare wellness exams do not have an exam portion to the visit. Some medicare companies allow for a physical, some do not allow a yearly physical. If your medicare allows a yearly physical you can schedule the medicare wellness with our nurse Maudie Mercury and have your physical with your provider after, on the same day. Please check with insurance for your full benefits.   Late Policy/No Shows:  - all new patients should arrive 15-30 minutes earlier than appointment to allow [place] time  to  obtain all personal demographics,  insurance information and for you to complete office paperwork. - All established patients should arrive 10-15 minutes earlier than appointment time to update all information and be checked in .  - In our best efforts to run on time, if you are late for your appointment you will be asked to either reschedule or if able, we will work you back into the schedule. There will be a wait time to work you back in the schedule,  depending on availability.  - If you are unable to make it  to your appointment as scheduled, please call 24 hours ahead of time to allow [place] to fill the time slot with someone else who needs to be seen. If you do not cancel your appointment ahead of time, you may be charged a no show fee.

## 2016-10-21 NOTE — Progress Notes (Signed)
Patient ID: Erika Gardner, female  DOB: 27-Aug-1976, 41 y.o.   MRN: [id number] Patient Care Team    Relationship Specialty Notifications Start End  Natalia Leatherwood, DO PCP - General Family Medicine  06/01/15   Alfredo Martinez, MD Consulting Physician Urology  09/12/16   Mariam Dollar, MD Referring Physician Nurse Practitioner  10/21/16    Comment: Gynecology  Christine R. Delford Field, [place]  Podiatry  10/21/16   Hilda Lias, MD Consulting Physician Neurosurgery  10/21/16   Benjiman Core, MD Consulting Physician Oncology  10/21/16   Edyth Gunnels. Phineas Inches, MD Referring Physician Surgery  10/21/16   [facility]  Gastroenterology  10/21/16    Subjective:  Erika Gardner is a 41 y.o.  Female  present for CPE. All past medical history, surgical history, allergies, family history, immunizations, medications and social history were updated in the electronic medical record today. All recent labs, ED visits and hospitalizations within the last year were reviewed.  Health maintenance:  Colonoscopy: No FHX. Est with GI with EGD and colonoscopy.  Mammogram: 05/2013 right breast mass, benign, routine screen at 41 recommended. Start screening next 2019. Cervical cancer screening: last pap: 10/03/2015; normal negative HPV, by Claiborne Billings Immunizations: tdap UTD 2016, Influenza declined(encouraged yearly) Infectious disease screening: HIV completed DEXA: N/A Assistive device: None Oxygen use: None Patient has a Dental home. Hospitalizations/ED visits: None  Immunization History  Administered Date(s) Administered  . Tdap 06/01/2015     Past Medical History:  Diagnosis Date  . Abnormal mammogram of right breast 09/2012   stable hypoechoic lesion  . Anxiety   . Asthma    somewhat sounding like exercise induced relative to temperature   . B12 deficiency   . Carpal tunnel syndrome    both wrists  . Complication of anesthesia    pt. reports that the combination of morphine & / or dilaudid  with anesthesia  presents with itching    . Fibromyalgia   . GERD (gastroesophageal reflux disease)   . Insomnia   . Neuromuscular disorder (HCC)   . Panic attacks   . Seizures (HCC)    x2 with a syncopal episode (03/2015- last episode)  . Sleep apnea    not needing treatment, through a sleep study done showing that if she is on her back she has slight sleep apnea.    . Snoring   . Thyroid disease    multinodular goiter  . Vertigo   . Vitamin D deficiency    Allergies  Allergen Reactions  . Savella [Milnacipran Hcl] Other (See Comments)    MOODY ANGRY PALPITATIONS  . Hydromorphone Hcl Itching   Past Surgical History:  Procedure Laterality Date  . APPENDECTOMY    . CHOLECYSTECTOMY  2014  . DILATION AND CURETTAGE OF UTERUS  2007   Post miscarriage  . HERNIA REPAIR     umbilicus  . KNEE ARTHROSCOPY Right 2014   wear and tear  . LUMBAR LAMINECTOMY/DECOMPRESSION MICRODISCECTOMY Bilateral 05/10/2016   Procedure: Bilateral Lumbar five-sacral one Microdiskectomy;  Surgeon: Hilda Lias, MD;  Location: [facility];  Service: Neurosurgery;  Laterality: Bilateral;  bilateral  . VAGINAL DELIVERY     x3   Family History  Problem Relation Age of Onset  . Arthritis Maternal Grandmother   . Hearing loss Maternal Grandmother   . Cancer Maternal Grandfather     skin  . Heart disease Maternal Grandfather   . Arthritis Paternal Grandmother   . Cancer Paternal Grandfather  kidney  . Heart disease Paternal Grandfather   . Kidney disease Paternal Grandfather    Social History   Social History  . Marital status: Married    Spouse name: N/A  . Number of children: N/A  . Years of education: N/A   Occupational History  . Not on file.   Social History Main Topics  . Smoking status: Never Smoker  . Smokeless tobacco: Never Used  . Alcohol use Yes     Comment: socially- occasional- 4-5 x in a month    . Drug use: No  . Sexual activity: Yes   Other Topics Concern  . Not  on file   Social History Narrative   Married. 3 children, stay at home mother. Father in law live with them as well. No alcohol, former smoker. No recreational drugs. Wears her seatbelt. Wears a bike helmet. Exercises at least 3 times a week. Smoke alarm in the home. There are firearms in the home.   Allergies as of 10/21/2016      Reactions   Savella [milnacipran Hcl] Other (See Comments)   MOODY ANGRY PALPITATIONS   Hydromorphone Hcl Itching      Medication List       Accurate as of 10/21/16 11:39 AM. Always use your most recent med list.          albuterol 108 (90 Base) MCG/ACT inhaler Commonly known as:  PROVENTIL HFA;VENTOLIN HFA Inhale 2 puffs into the lungs every 6 (six) hours as needed for wheezing or shortness of breath.   DEXILANT 60 MG capsule Generic drug:  dexlansoprazole   dicyclomine 20 MG tablet Commonly known as:  BENTYL   escitalopram 10 MG tablet Commonly known as:  LEXAPRO Take 1 tablet (10 mg total) by mouth at bedtime.   norethindrone-ethinyl estradiol 1-20 MG-MCG tablet Commonly known as:  JUNEL FE,GILDESS FE,LOESTRIN FE Take 1 tablet by mouth at bedtime.   omeprazole 20 MG capsule Commonly known as:  PRILOSEC Take 2 capsules (40 mg total) by mouth daily.   ranitidine 150 MG tablet Commonly known as:  ZANTAC Take 1 tablet (150 mg total) by mouth 2 (two) times daily.   spironolactone 100 MG tablet Commonly known as:  ALDACTONE Take 1 tablet (100 mg total) by mouth daily.   vitamin B-12 1000 MCG tablet Commonly known as:  CYANOCOBALAMIN Take 1 tablet (1,000 mcg total) by mouth daily.   Vitamin D3 1000 units Caps Take 3,000 Units by mouth.        Recent Results (from the past 2160 hour(s))  COMPLETE METABOLIC PANEL WITH GFR     Status: Abnormal   Collection Time: 09/10/16 10:20 AM  Result Value Ref Range   Sodium 139 135 - 146 mmol/L   Potassium 4.3 3.5 - 5.3 mmol/L   Chloride 105 98 - 110 mmol/L   CO2 26 20 - 31 mmol/L    Glucose, Bld 103 (H) 65 - 99 mg/dL   BUN 12 7 - 25 mg/dL   Creat 1.61 0.96 - 0.45 mg/dL   Total Bilirubin 0.3 0.2 - 1.2 mg/dL   Alkaline Phosphatase 57 33 - 115 U/L   AST 20 10 - 30 U/L   ALT 20 6 - 29 U/L   Total Protein 7.1 6.1 - 8.1 g/dL   Albumin 4.1 3.6 - 5.1 g/dL   Calcium 9.2 8.6 - 40.9 mg/dL   GFR, Est African American >89 >=60 mL/min   GFR, Est Non African American >89 >=60 mL/min  Lipase  Status: None   Collection Time: 09/10/16 10:20 AM  Result Value Ref Range   Lipase 27.0 11.0 - 59.0 U/L   INDICATIONS: Unspecified follow-up examination  PROCEDURE:MAMMO US BREAST RIGHT - Jun 09 2013  [employer] Accession #:[id number] [employer] Workflow Accession #: [id number] EXAM: RIGHT BREAST ULTRASOUND  INDICATION: Followup small hypoechoic focus lower outer right breast,  previous FNA, benign histology TECHNIQUE: Focused ultrasound right breast COMPARISON: 10/08/2012, 02/05/2012 studies FINDINGS: Small hypoechoic focus is not identified on today's study. No  significant ultrasound findings. No cyst or masses.  IMPRESSION: Nonvisualized or resolved small hypoechoic focus right  breast, consistent with benign etiologies. RECOMMENDATION: Return to routine screening mammography with bilateral  screening mammogram at age 41 BI-RADS CATEGORY 2:Benign, letter sent  ROS: 14 pt review of systems performed and negative (unless mentioned in an HPI)  Objective: BP 106/75 (BP Location: Right Arm, Patient Position: Sitting, Cuff Size: Normal)   Pulse 71   Temp 98.4 F (36.9 C)   Resp 20   Ht 5\' 5"  (1.651 m)   Wt 157 lb 8 oz (71.4 kg)   LMP 09/24/2016   SpO2 100%   BMI 26.21 kg/m  Gen: Afebrile. No acute distress. Nontoxic in appearance, well-developed, well-nourished,  Pleasant caucasian female.  HENT: AT. [place]. Bilateral TM visualized and normal in appearance, normal external auditory canal. MMM, no oral lesions, adequate dentition. Bilateral nares within normal limits. Throat  without erythema, ulcerations or exudates. no Cough on exam, no hoarseness on exam. Eyes:Pupils Equal Round Reactive to light, Extraocular movements intact,  Conjunctiva without redness, discharge or icterus. Neck/lymp/endocrine: Supple,no lymphadenopathy, mild (chronic) thyromegaly CV: RRR, no edema, +2/4 P posterior tibialis pulses. no carotid bruits. No JVD. Chest: CTAB, no wheeze, rhonchi or crackles. Normal Respiratory effort. Good Air movement. Abd: Soft. flat. NTND. BS present. no Masses palpated. No hepatosplenomegaly. No rebound tenderness or guarding. Skin: no rashes, purpura or petechiae. Warm and well-perfused. Skin intact. Neuro/Msk:  Normal gait. PERLA. EOMi. Alert. Oriented x3. Cranial nerves II through XII intact. Muscle strength 5/5 upper/lower extremity. DTRs equal bilaterally. Psych: Normal affect, dress and demeanor. Normal speech. Normal thought content and judgment.  Assessment/plan: Erika Gardner is a 41 y.o. female present for CPE. Encounter for general adult medical examination with abnormal findings Patient was encouraged to exercise greater than 150 minutes a week. Patient was encouraged to choose a diet filled with fresh fruits and vegetables, and lean meats. AVS provided to patient today for education/recommendation on gender specific health and safety maintenance. Colonoscopy: No FHX. Est with GI with EGD and colonoscopy with GI specialist.  Mammogram: 05/2013 right breast mass, benign, routine screen at 41 recommended. Start screening next CPE 2019. Cervical cancer screening: last pap: 10/03/2015; normal negative HPV, by Claiborne BillingsKuneff, F/U 3 years Immunizations: tdap UTD 2016, Influenza declined(encouraged yearly) Infectious disease screening: HIV completed BMI 26.0-26.9,adult - HgB A1c - Lipid panel  History of non anemic vitamin B12 deficiency - CBC w/Diff Vitamin D deficiency - continue 1000u daily - Vitamin D 1,25 dihydroxy Hemochromatosis, unspecified  hemochromatosis type - Iron and TIBC Multiple thyroid nodules - rpt US ordered for 12 mos US f/u recommendations.  - TSH - US SOFT TISSUE HEAD AND NECK; Future Screening cholesterol level - Lipid panel Diabetes mellitus screening - HgB A1c B12 deficiency/fatigue -continue daily supplement - B12  Return in about 1 year (around 10/21/2017) for CPE.  Electronically signed by: Felix Pacinienee Delailah Spieth, DO [facility]

## 2016-10-22 ENCOUNTER — Ambulatory Visit (HOSPITAL_BASED_OUTPATIENT_CLINIC_OR_DEPARTMENT_OTHER)
Admission: RE | Admit: 2016-10-22 | Discharge: 2016-10-22 | Disposition: A | Discharge status: Home / Self Care | Payer: Managed Care, Other (non HMO) | Source: Ambulatory Visit | Attending: Family Medicine | Admitting: Family Medicine

## 2016-10-22 DIAGNOSIS — E042 Nontoxic multinodular goiter: Secondary | ICD-10-CM

## 2016-10-22 DIAGNOSIS — E041 Nontoxic single thyroid nodule: Secondary | ICD-10-CM | POA: Diagnosis not present

## 2016-10-23 ENCOUNTER — Telehealth: Payer: Self-pay | Admitting: Family Medicine

## 2016-10-23 NOTE — Telephone Encounter (Signed)
Spoke with patient reviewed lab results and instructions. Patient verbalized understanding. 

## 2016-10-23 NOTE — Telephone Encounter (Signed)
Message left on patient's voicemail to return call.

## 2016-10-23 NOTE — Telephone Encounter (Signed)
Please call pt: - all her labs are normal.  - The only one I am waiting on is vitamin D. If it is abnormal we will let her know, otherwise will just release to mychart for her when it results.  - Her thyroid US was stable. Per recommendations, she does not need further US on thyroid nodule.

## 2016-10-24 LAB — VITAMIN D 1,25 DIHYDROXY
VITAMIN D 1, 25 (OH) TOTAL: 81 pg/mL — AB (ref 18–72)
VITAMIN D3 1, 25 (OH): 81 pg/mL
Vitamin D2 1, 25 (OH)2: <8 pg/mL

## 2016-11-07 ENCOUNTER — Other Ambulatory Visit: Payer: Self-pay | Admitting: Family Medicine

## 2016-12-24 ENCOUNTER — Ambulatory Visit (INDEPENDENT_AMBULATORY_CARE_PROVIDER_SITE_OTHER): Payer: 59 | Admitting: Physician Assistant

## 2016-12-24 ENCOUNTER — Ambulatory Visit (INDEPENDENT_AMBULATORY_CARE_PROVIDER_SITE_OTHER): Payer: 59

## 2016-12-24 ENCOUNTER — Encounter: Payer: Self-pay | Admitting: Physician Assistant

## 2016-12-24 VITALS — BP 122/80 | HR 114 | Temp 98.7°F | Ht 65.0 in | Wt 156.4 lb

## 2016-12-24 DIAGNOSIS — J069 Acute upper respiratory infection, unspecified: Secondary | ICD-10-CM | POA: Diagnosis not present

## 2016-12-24 DIAGNOSIS — J4531 Mild persistent asthma with (acute) exacerbation: Secondary | ICD-10-CM

## 2016-12-24 MED ORDER — DOXYCYCLINE HYCLATE 100 MG PO TABS: 100.0000 mg | ORAL_TABLET | Freq: Two times a day (BID) | ORAL | 0 refills | Status: DC

## 2016-12-24 MED ORDER — PREDNISONE 10 MG PO TABS: 10.0000 mg | ORAL_TABLET | Freq: Every day | ORAL | 0 refills | Status: DC

## 2016-12-24 MED ORDER — HYDROCOD POLST-CPM POLST ER 10-8 MG/5ML PO SUER: 5.0000 mL | Freq: Every evening | ORAL | 0 refills | Status: DC | PRN

## 2016-12-24 NOTE — Progress Notes (Signed)
Pre visit review using our clinic review tool, if applicable. No additional management support is needed unless otherwise documented below in the visit note. 

## 2016-12-24 NOTE — Patient Instructions (Signed)
We will call you with your chest xray results and let you know once your prescriptions have all been sent over.  If your symptoms worsen in any way, please seek medical attention.   Upper Respiratory Infection, Adult Most upper respiratory infections (URIs) are a viral infection of the air passages leading to the lungs. A URI affects the nose, throat, and upper air passages. The most common type of URI is nasopharyngitis and is typically referred to as "the common cold." URIs run their course and usually go away on their own. Most of the time, a URI does not require medical attention, but sometimes a bacterial infection in the upper airways can follow a viral infection. This is called a secondary infection. Sinus and middle ear infections are common types of secondary upper respiratory infections. Bacterial pneumonia can also complicate a URI. A URI can worsen asthma and chronic obstructive pulmonary disease (COPD). Sometimes, these complications can require emergency medical care and may be life threatening. What are the causes? Almost all URIs are caused by viruses. A virus is a type of germ and can spread from one person to another. What increases the risk? You may be at risk for a URI if:  You smoke.  You have chronic heart or lung disease.  You have a weakened defense (immune) system.  You are very young or very old.  You have nasal allergies or asthma.  You work in crowded or poorly ventilated areas.  You work in health care facilities or schools. What are the signs or symptoms? Symptoms typically develop 2-3 days after you come in contact with a cold virus. Most viral URIs last 7-10 days. However, viral URIs from the influenza virus (flu virus) can last 14-18 days and are typically more severe. Symptoms may include:  Runny or stuffy (congested) nose.  Sneezing.  Cough.  Sore throat.  Headache.  Fatigue.  Fever.  Loss of appetite.  Pain in your forehead, behind  your eyes, and over your cheekbones (sinus pain).  Muscle aches. How is this diagnosed? Your health care provider may diagnose a URI by:  Physical exam.  Tests to check that your symptoms are not due to another condition such as:  Strep throat.  Sinusitis.  Pneumonia.  Asthma. How is this treated? A URI goes away on its own with time. It cannot be cured with medicines, but medicines may be prescribed or recommended to relieve symptoms. Medicines may help:  Reduce your fever.  Reduce your cough.  Relieve nasal congestion. Follow these instructions at home:  Take medicines only as directed by your health care provider.  Gargle warm saltwater or take cough drops to comfort your throat as directed by your health care provider.  Use a warm mist humidifier or inhale steam from a shower to increase air moisture. This may make it easier to breathe.  Drink enough fluid to keep your urine clear or pale yellow.  Eat soups and other clear broths and maintain good nutrition.  Rest as needed.  Return to work when your temperature has returned to normal or as your health care provider advises. You may need to stay home longer to avoid infecting others. You can also use a face mask and careful hand washing to prevent spread of the virus.  Increase the usage of your inhaler if you have asthma.  Do not use any tobacco products, including cigarettes, chewing tobacco, or electronic cigarettes. If you need help quitting, ask your health care provider. How is  this prevented? The best way to protect yourself from getting a cold is to practice good hygiene.  Avoid oral or hand contact with people with cold symptoms.  Wash your hands often if contact occurs. There is no clear evidence that vitamin C, vitamin E, echinacea, or exercise reduces the chance of developing a cold. However, it is always recommended to get plenty of rest, exercise, and practice good nutrition. Contact a health care  provider if:  You are getting worse rather than better.  Your symptoms are not controlled by medicine.  You have chills.  You have worsening shortness of breath.  You have brown or red mucus.  You have yellow or brown nasal discharge.  You have pain in your face, especially when you bend forward.  You have a fever.  You have swollen neck glands.  You have pain while swallowing.  You have white areas in the back of your throat. Get help right away if:  You have severe or persistent:  Headache.  Ear pain.  Sinus pain.  Chest pain.  You have chronic lung disease and any of the following:  Wheezing.  Prolonged cough.  Coughing up blood.  A change in your usual mucus.  You have a stiff neck.  You have changes in your:  Vision.  Hearing.  Thinking.  Mood. This information is not intended to replace advice given to you by your health care provider. Make sure you discuss any questions you have with your health care provider. Document Released: 03/12/2001 Document Revised: 05/19/2016 Document Reviewed: 12/22/2013 Elsevier Interactive Patient Education  2017 [employer].

## 2016-12-24 NOTE — Progress Notes (Addendum)
Erika Gardner is a [age] y.o. female here for a follow up of a pre-existing problem.   History of Present Illness:   Chief Complaint  Patient presents with  . Cough    getting worse, expectorating clear sputum  . Chest congestion  . Bilateral ear pain    HPI  Patient reports that on Wednesday of last week she developed a cough. Over the weekend she went to urgent care, where she was told she had an upper respiratory infection, and was prescribed Tessalon Perles, nasal spray6 and 40 mg prednisone for 5 days. She says since that time she has felt worse. Her cough is not well-controlled and is keeping her up at night. She has clear expectorant. She also has bilateral ear pain, shortness of breath, sore throat. She states she is also taking Mucinex and Sudafed without much relief. She also is endorsing sinus pressure. She denies any recent travel. She denies fever. She is drinking tons of fluids, and pushing herself to eat despite poor appetite. She is also using an albuterol inhaler every 4-6 hours. She denies any sick contacts.  PMHx, SurgHx, SocialHx, Medications, and Allergies were reviewed in the Visit Navigator and updated as appropriate.  Current Medications:   Current Outpatient Prescriptions:  .  albuterol (PROVENTIL HFA;VENTOLIN HFA) 108 (90 BASE) MCG/ACT inhaler, Inhale 2 puffs into the lungs every 6 (six) hours as needed for wheezing or shortness of breath., Disp: 1 Inhaler, Rfl: 0 .  benzonatate (TESSALON) 100 MG capsule, Take 100 mg by mouth 3 (three) times daily. , Disp: , Rfl:  .  Cholecalciferol (VITAMIN D3) 1000 units CAPS, Take 3,000 Units by mouth., Disp: , Rfl:  .  escitalopram (LEXAPRO) 10 MG tablet, Take 1 tablet (10 mg total) by mouth at bedtime., Disp: 90 tablet, Rfl: 0 .  ipratropium (ATROVENT) 0.06 % nasal spray, , Disp: , Rfl:  .  norethindrone-ethinyl estradiol (JUNEL FE,GILDESS FE,LOESTRIN FE) 1-20 MG-MCG tablet, Take 1 tablet by mouth at bedtime. , Disp: , Rfl:   .  omeprazole (PRILOSEC) 20 MG capsule, Take 2 capsules (40 mg total) by mouth daily. (Patient taking differently: Take 20 mg by mouth daily. ), Disp: 60 capsule, Rfl: 0 .  predniSONE (DELTASONE) 20 MG tablet, , Disp: , Rfl:  .  ranitidine (ZANTAC) 150 MG tablet, TAKE 1 TABLET BY MOUTH TWICE A DAY, Disp: 60 tablet, Rfl: 2 .  spironolactone (ALDACTONE) 100 MG tablet, Take 1 tablet (100 mg total) by mouth daily., Disp: 90 tablet, Rfl: 1 .  vitamin B-12 (CYANOCOBALAMIN) 1000 MCG tablet, Take 1 tablet (1,000 mcg total) by mouth daily., Disp: 90 tablet, Rfl: 3 .  chlorpheniramine-HYDROcodone (TUSSIONEX PENNKINETIC ER) 10-8 MG/5ML SUER, Take 5 mLs by mouth at bedtime as needed for cough., Disp: 60 mL, Rfl: 0 .  doxycycline (VIBRA-TABS) 100 MG tablet, Take 1 tablet (100 mg total) by mouth 2 (two) times daily., Disp: 20 tablet, Rfl: 0 .  predniSONE (DELTASONE) 10 MG tablet, Take 1 tablet (10 mg total) by mouth daily with breakfast. 3 tabs daily x 3 days, then 2 tabs daily x 3 days, then 1 tab daily x 3 days, Disp: 9 tablet, Rfl: 0   Review of Systems:   Review of Systems  Constitutional: Positive for chills and malaise/fatigue.  HENT: Positive for congestion, ear pain, sinus pain and sore throat.   Respiratory: Positive for cough, sputum production, shortness of breath and wheezing.        Expectorating clear sputum   Gastrointestinal:  Negative.   Neurological: Positive for dizziness and headaches.    Vitals:   Vitals:   12/24/16 0910  BP: 122/80  Pulse: (!) 114  Temp: 98.7 F (37.1 C)  TempSrc: Oral  SpO2: 98%  Weight: 156 lb 6.1 oz (70.9 kg)  Height: 5\' 5"  (1.651 m)     Body mass index is 26.02 kg/m.  Physical Exam:   Physical Exam  Constitutional: She appears well-developed. She is cooperative.  Non-toxic appearance. She does not have a sickly appearance. She does not appear ill. No distress.  HENT:  Head: Normocephalic and atraumatic.  Right Ear: Tympanic membrane, external  ear and ear canal normal. Tympanic membrane is not erythematous, not retracted and not bulging.  Left Ear: Tympanic membrane, external ear and ear canal normal. Tympanic membrane is not erythematous, not retracted and not bulging.  Nose: Right sinus exhibits maxillary sinus tenderness. Right sinus exhibits no frontal sinus tenderness. Left sinus exhibits maxillary sinus tenderness. Left sinus exhibits no frontal sinus tenderness.  Mouth/Throat: Uvula is midline. Posterior oropharyngeal erythema present. No posterior oropharyngeal edema.  Eyes: Conjunctivae and lids are normal.  Neck: Trachea normal.  Cardiovascular: Normal rate, regular rhythm, S1 normal, S2 normal and normal heart sounds.   Pulmonary/Chest: Effort normal. She has decreased breath sounds. She has no wheezes. She has no rhonchi. She has no rales.  Lymphadenopathy:    She has no cervical adenopathy.  Neurological: She is alert.  Skin: Skin is warm, dry and intact.  Psychiatric: She has a normal mood and affect. Her speech is normal and behavior is normal.  Nursing note and vitals reviewed.  PA and lateral CXR: IMPRESSION: No pneumonia or effusion.  Question bronchitis.  Assessment and Plan:    Erika Gardner was seen today for cough, chest congestion and bilateral ear pain.  Diagnoses and all orders for this visit:  Upper respiratory tract infection, unspecified type -     DG Chest 2 View; Future  Mild persistent asthma with acute exacerbation  Other orders -     chlorpheniramine-HYDROcodone (TUSSIONEX PENNKINETIC ER) 10-8 MG/5ML SUER; Take 5 mLs by mouth at bedtime as needed for cough. -     predniSONE (DELTASONE) 10 MG tablet; Take 1 tablet (10 mg total) by mouth daily with breakfast. 3 tabs daily x 3 days, then 2 tabs daily x 3 days, then 1 tab daily x 3 days -     doxycycline (VIBRA-TABS) 100 MG tablet; Take 1 tablet (100 mg total) by mouth 2 (two) times daily.   Chest x-ray without evidence of pneumonia but does  question bronchitis. Given sinus tenderness and general lack of improvement despite treatment we'll go ahead and begin doxycycline. I have also given her Tussionex, she does recall an "allergy" with hydromorphone that has caused itching x 1 time, but has taken codeine products since and has tolerated without issues. I advised that if she went to develop any issues with breathing or airway obstruction with this medication she needs to go to the ER immediately. I will also extend her prednisone. Continue to push fluids. I encouraged her to follow up if she doesn't improve or worsens. Patient is agreeable to plan.  . Reviewed expectations re: course of current medical issues. . Discussed self-management of symptoms. . Outlined signs and symptoms indicating need for more acute intervention. . Patient verbalized understanding and all questions were answered. . See orders for this visit as documented in the electronic medical record. . Patient received an After-Visit Summary.  Inda Coke, PA-C

## 2017-01-02 ENCOUNTER — Encounter: Payer: Self-pay | Admitting: Family Medicine

## 2017-01-02 ENCOUNTER — Ambulatory Visit (INDEPENDENT_AMBULATORY_CARE_PROVIDER_SITE_OTHER): Payer: 59 | Admitting: Family Medicine

## 2017-01-02 VITALS — BP 104/73 | HR 84 | Temp 98.1°F | Resp 20 | Wt 156.2 lb

## 2017-01-02 DIAGNOSIS — H5711 Ocular pain, right eye: Secondary | ICD-10-CM | POA: Insufficient documentation

## 2017-01-02 DIAGNOSIS — T887XXA Unspecified adverse effect of drug or medicament, initial encounter: Secondary | ICD-10-CM

## 2017-01-02 DIAGNOSIS — T50905A Adverse effect of unspecified drugs, medicaments and biological substances, initial encounter: Secondary | ICD-10-CM

## 2017-01-02 NOTE — Patient Instructions (Signed)
Stop steroid today.   I believe the feelings you are experiencing are from extended steroid use.   If eye becomes painful, draining, visual changes, flashes etc, please be seen immediately in ED or eye doctor. If eye pressure not improved by Monday call your eye doctor.    Glaucoma Glaucoma happens when the fluid pressure in the eyeball is too high. If the pressure stays high for too long, the eye may become damaged. This can cause a loss of vision. The most common type of glaucoma causes pressure in the eye to go up slowly. There may be no symptoms at first. Testing for this condition can help to find the condition before damage occurs. Early treatment can often stop vision loss. Follow these instructions at home:  Take medicines only as told by your doctor.  Use your eye drops exactly as told. You will probably need to use these for the rest of your life.  Exercise often. Talk with your doctor about which types of exercise are safe for you. Avoid standing on your head.  Keep all follow-up visits as told by your doctor. This is important. Contact a doctor if:  Your symptoms get worse. Get help right away if:  You have bad pain in your eye.  You have vision problems.  You have a bad headache in the area around your eye.  You feel sick to your stomach (nauseous) or you throw up (vomit).  You start to have problems with your other eye. This information is not intended to replace advice given to you by your health care provider. Make sure you discuss any questions you have with your health care provider. Document Released: 06/25/2008 Document Revised: 02/22/2016 Document Reviewed: 06/28/2014 Elsevier Interactive Patient Education  2017 [employer].

## 2017-01-02 NOTE — Progress Notes (Signed)
Erika Gardner , 06-20-1976, 41 y.o., female MRN: [id number] Patient Care Team    Relationship Specialty Notifications Start End  Natalia Leatherwood, DO PCP - General Family Medicine  06/01/15   Alfredo Martinez, MD Consulting Physician Urology  09/12/16   Mariam Dollar, MD Referring Physician Nurse Practitioner  10/21/16    Comment: Gynecology  Christine R. Delford Field, [place]  Podiatry  10/21/16   Hilda Lias, MD Consulting Physician Neurosurgery  10/21/16   Benjiman Core, MD Consulting Physician Oncology  10/21/16   Edyth Gunnels. Phineas Inches, MD Referring Physician Surgery  10/21/16   [facility]  Gastroenterology  10/21/16     CC: Eye pressure Subjective: Pt presents for an OV with complaints of right eye pressure of 2-3 days duration.  Associated symptoms include "feeling loopy". Patient states that she has been ill over the last few weeks with an upper respiratory infection. She was seen at urgent care and provided with a 5 day course of prednisone and cough suppressant. Her symptoms did not improve and she was seen again last week by another provider and given doxycycline 10 day course, and prednisone taper. She is on the left is upper doxycycline and has 1 additional prednisone to take tomorrow. She states since taking the prednisone she feels disconnected, fatigued, unable to focus. She is extremely tired. She denies eye drainage, redness, flashes of light or other visual changes with the exception of occasional blurriness and bilateral eyes while on prednisone. She denies any light sensitivity. As far as her upper respiratory infection goes, it has greatly improved. She has no personal or family history of glaucoma. She has established with Dr. Clearance Coots, ophthalmology.  Depression screen [facility] 2/9 10/21/2016  Decreased Interest 0  Down, Depressed, Hopeless 0  PHQ - 2 Score 0    Allergies  Allergen Reactions  . Savella [Milnacipran Hcl] Other (See Comments)    MOODY ANGRY PALPITATIONS    . Hydromorphone Hcl Itching   Social History  Substance Use Topics  . Smoking status: Never Smoker  . Smokeless tobacco: Never Used  . Alcohol use Yes     Comment: socially- occasional- 4-5 x in a month     Past Medical History:  Diagnosis Date  . Abnormal mammogram of right breast 09/2012   stable hypoechoic lesion  . Anxiety   . Asthma    somewhat sounding like exercise induced relative to temperature   . B12 deficiency   . Carpal tunnel syndrome    both wrists  . Complication of anesthesia    pt. reports that the combination of morphine & / or dilaudid with anesthesia  presents with itching    . Fibromyalgia   . GERD (gastroesophageal reflux disease)   . Insomnia   . Neuromuscular disorder (HCC)   . Panic attacks   . Seizures (HCC)    x2 with a syncopal episode (03/2015- last episode)  . Sleep apnea    not needing treatment, through a sleep study done showing that if she is on her back she has slight sleep apnea.    . Snoring   . Thyroid disease    multinodular goiter  . Vertigo   . Vitamin D deficiency    Past Surgical History:  Procedure Laterality Date  . APPENDECTOMY    . CHOLECYSTECTOMY  2014  . DILATION AND CURETTAGE OF UTERUS  2007   Post miscarriage  . HERNIA REPAIR     umbilicus  . KNEE ARTHROSCOPY Right 2014  wear and tear  . LUMBAR LAMINECTOMY/DECOMPRESSION MICRODISCECTOMY Bilateral 05/10/2016   Procedure: Bilateral Lumbar five-sacral one Microdiskectomy;  Surgeon: Hilda Lias, MD;  Location: [facility];  Service: Neurosurgery;  Laterality: Bilateral;  bilateral  . VAGINAL DELIVERY     x3   Family History  Problem Relation Age of Onset  . Arthritis Maternal Grandmother   . Hearing loss Maternal Grandmother   . Cancer Maternal Grandfather     skin  . Heart disease Maternal Grandfather   . Arthritis Paternal Grandmother   . Cancer Paternal Grandfather     kidney  . Heart disease Paternal Grandfather   . Kidney disease Paternal Grandfather     Allergies as of 01/02/2017      Reactions   Savella [milnacipran Hcl] Other (See Comments)   MOODY ANGRY PALPITATIONS   Hydromorphone Hcl Itching      Medication List       Accurate as of 01/02/17  8:58 AM. Always use your most recent med list.          albuterol 108 (90 Base) MCG/ACT inhaler Commonly known as:  PROVENTIL HFA;VENTOLIN HFA Inhale 2 puffs into the lungs every 6 (six) hours as needed for wheezing or shortness of breath.   benzonatate 100 MG capsule Commonly known as:  TESSALON Take 100 mg by mouth 3 (three) times daily.   chlorpheniramine-HYDROcodone 10-8 MG/5ML Suer Commonly known as:  TUSSIONEX PENNKINETIC ER Take 5 mLs by mouth at bedtime as needed for cough.   doxycycline 100 MG tablet Commonly known as:  VIBRA-TABS Take 1 tablet (100 mg total) by mouth 2 (two) times daily.   escitalopram 10 MG tablet Commonly known as:  LEXAPRO Take 1 tablet (10 mg total) by mouth at bedtime.   ipratropium 0.06 % nasal spray Commonly known as:  ATROVENT   norethindrone-ethinyl estradiol 1-20 MG-MCG tablet Commonly known as:  JUNEL FE,GILDESS FE,LOESTRIN FE Take 1 tablet by mouth at bedtime.   omeprazole 20 MG capsule Commonly known as:  PRILOSEC Take 2 capsules (40 mg total) by mouth daily.   predniSONE 10 MG tablet Commonly known as:  DELTASONE Take 1 tablet (10 mg total) by mouth daily with breakfast. 3 tabs daily x 3 days, then 2 tabs daily x 3 days, then 1 tab daily x 3 days   ranitidine 150 MG tablet Commonly known as:  ZANTAC TAKE 1 TABLET BY MOUTH TWICE A DAY   spironolactone 100 MG tablet Commonly known as:  ALDACTONE Take 1 tablet (100 mg total) by mouth daily.   vitamin B-12 1000 MCG tablet Commonly known as:  CYANOCOBALAMIN Take 1 tablet (1,000 mcg total) by mouth daily.   Vitamin D3 1000 units Caps Take 3,000 Units by mouth.       No results found for this or any previous visit (from the past 24 hour(s)). No results  found.   ROS: Negative, with the exception of above mentioned in HPI   Objective:  BP 104/73 (BP Location: Left Arm, Patient Position: Sitting, Cuff Size: Normal)   Pulse 84   Temp 98.1 F (36.7 C)   Resp 20   Wt 156 lb 4 oz (70.9 kg)   LMP 12/20/2016   SpO2 98%   BMI 26.00 kg/m  Body mass index is 26 kg/m. Gen: Afebrile. No acute distress. Nontoxic in appearance, well developed, well nourished. Pleasant Caucasian female. HENT: AT. [place].  MMM, no oral lesions.  Eyes:Pupils Equal Round Reactive to light, Extraocular movements intact,  Orbits,  eyelids, sclera are normal. Conjunctiva without redness, discharge or icterus. No light sensitivity. No global pain to external pressure. Vision is grossly intact, and funduscopic examination is unremarkable. Neck/lymp/endocrine: Supple, no lymphadenopathy CV: RRR , no edema Neuro:  Normal gait. PERLA. EOMi. Alert. Oriented x3  Psych: Normal affect, dress and demeanor. Normal speech. Normal thought content and judgment.  Assessment/Plan: Erika Gardner is a 41 y.o. female present for OV for  Discomfort of right eye Adverse effect of drug, initial encounter - Discussed with patient her symptoms sound as if she is having a side effect to the extended course of prednisone she had been placed on. She is finished with antibiotics and prednisone today, therefore I suspect she will see a resolution in her symptoms within the next few days. - I do have some concern over the right eye pressure, although her exam was normal today and she did not have pain with external global pressure, I want her to monitor this over the weekend and if it becomes worsened with pain, redness, discharge, light sensitivity, visual changes, nausea or vomit etc, she is to be seen emergently in the emergency room or high doctor. If it is not completely resolved by Monday I would like her to call her eye doctor and be seen. AVS information was provided on glu[place] signs and  emergent need.   Reviewed expectations re: course of current medical issues.  Discussed self-management of symptoms.  Outlined signs and symptoms indicating need for more acute intervention.  Patient verbalized understanding and all questions were answered.  Patient received an After-Visit Summary.   electronically signed by:  Felix Pacini, DO  [facility] - [place]

## 2017-01-14 ENCOUNTER — Encounter: Payer: Self-pay | Admitting: Family Medicine

## 2017-01-24 ENCOUNTER — Other Ambulatory Visit: Payer: Self-pay | Admitting: *Deleted

## 2017-01-24 MED ORDER — ESCITALOPRAM OXALATE 10 MG PO TABS: 10.0000 mg | ORAL_TABLET | Freq: Every day | ORAL | 0 refills | Status: DC

## 2017-02-07 ENCOUNTER — Other Ambulatory Visit: Payer: Self-pay | Admitting: *Deleted

## 2017-02-07 MED ORDER — RANITIDINE HCL 150 MG PO TABS: 150.0000 mg | ORAL_TABLET | Freq: Two times a day (BID) | ORAL | 2 refills | Status: DC

## 2017-02-25 IMAGING — CR DG LUMBAR SPINE 1V
1 series · 1 of 1 positions shown · non-contrast
Comparison: None.

CLINICAL DATA: L5-S1 micro diskectomy

EXAM:
LUMBAR SPINE - 1 VIEW

[lat]
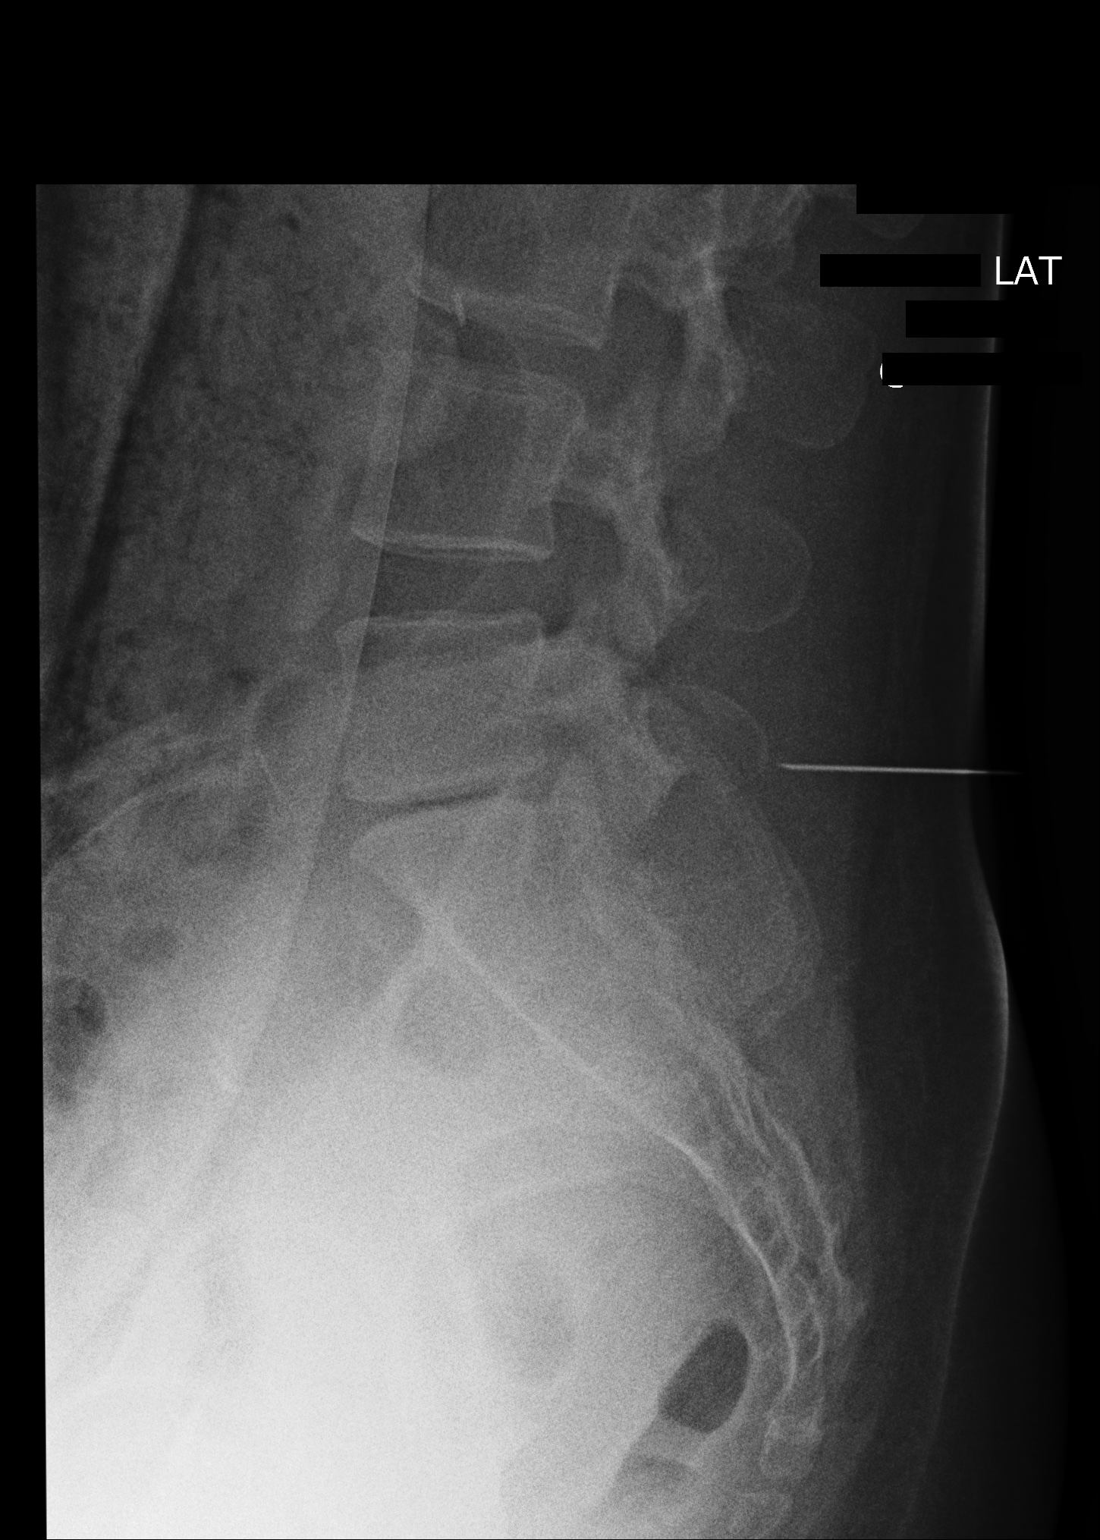

[1 of 1 positions shown; findings below may reference images not displayed]

FINDINGS: Single lateral view of the lumbar spine submitted. There is disc
space flattening with vacuum disc phenomenon at L5-S1 level. There
is a posterior needle at L5-S1 level.
IMPRESSION: Posterior localization needle at L5-S1 level.

## 2017-03-09 ENCOUNTER — Other Ambulatory Visit: Payer: Self-pay | Admitting: Family Medicine

## 2017-03-10 DIAGNOSIS — Z9289 Personal history of other medical treatment: Secondary | ICD-10-CM

## 2017-03-10 HISTORY — DX: Personal history of other medical treatment: Z92.89

## 2017-03-10 NOTE — Telephone Encounter (Signed)
Dr. Kuneff pt.  

## 2017-03-12 ENCOUNTER — Encounter: Payer: Self-pay | Admitting: Family Medicine

## 2017-03-13 ENCOUNTER — Other Ambulatory Visit: Payer: Self-pay | Admitting: *Deleted

## 2017-03-13 DIAGNOSIS — L700 Acne vulgaris: Secondary | ICD-10-CM

## 2017-03-13 MED ORDER — SPIRONOLACTONE 100 MG PO TABS: 100.0000 mg | ORAL_TABLET | Freq: Every day | ORAL | 0 refills | Status: DC

## 2017-03-17 ENCOUNTER — Encounter: Payer: Self-pay | Admitting: Family Medicine

## 2017-03-17 ENCOUNTER — Ambulatory Visit (INDEPENDENT_AMBULATORY_CARE_PROVIDER_SITE_OTHER): Payer: 59 | Admitting: Family Medicine

## 2017-03-17 VITALS — BP 112/82 | HR 79 | Temp 98.5°F | Resp 20 | Ht 65.0 in | Wt 161.5 lb

## 2017-03-17 DIAGNOSIS — G4700 Insomnia, unspecified: Secondary | ICD-10-CM

## 2017-03-17 DIAGNOSIS — F411 Generalized anxiety disorder: Secondary | ICD-10-CM | POA: Diagnosis not present

## 2017-03-17 MED ORDER — ESCITALOPRAM OXALATE 10 MG PO TABS: 10.0000 mg | ORAL_TABLET | Freq: Every day | ORAL | 1 refills | Status: DC

## 2017-03-17 MED ORDER — TRAZODONE HCL 50 MG PO TABS: 50.0000 mg | ORAL_TABLET | Freq: Every day | ORAL | 1 refills | Status: DC

## 2017-03-17 NOTE — Patient Instructions (Signed)
It was nice to see you today. Refills on lexapro.  Start trazodone 50 mg 1 hour before bed. Taper up by 25 mg every 3-4 days, to 100 mg or good sleep outcome. Stop at the dose that works for you or 100 mg a night.    Follow up in 1 month.   Have fun on vacation!!   Please help [place] help you:  We are honored you have chosen [facility] for your Primary Care home. Below you will find basic instructions that you may need to access in the future. Please help [place] help you by reading the instructions, which cover many of the frequent questions we experience.   Prescription refills and request:  -In order to allow more efficient response time, please call your pharmacy for all refills. They will forward the request electronically to [place]. This allows for the quickest possible response. Request left on a nurse line can take longer to refill, since these are checked as time allows between office patients and other phone calls.  - refill request can take up to 3-5 working days to complete.  - If request is sent electronically and request is appropiate, it is usually completed in 1-2 business days.  - all patients will need to be seen routinely for all chronic medical conditions requiring prescription medications (see follow-up below). If you are overdue for follow up on your condition, you will be asked to make an appointment and we will call in enough medication to cover you until your appointment (up to 30 days).  - all controlled substances will require a face to face visit to request/refill.  - if you desire your prescriptions to go through a new pharmacy, and have an active script at original pharmacy, you will need to call your pharmacy and have scripts transferred to new pharmacy. This is completed between the pharmacy locations and not by your provider.    Results: If any images or labs were ordered, it can take up to 1 week to get results depending on the test ordered and the lab/facility  running and resulting the test. - Normal or stable results, which do not need further discussion, may be released to your mychart immediately with attached note to you. A call may not be generated for normal results. Please make certain to sign up for mychart. If you have questions on how to activate your mychart you can call the front office.  - If your results need further discussion, our office will attempt to contact you via phone, and if unable to reach you after 2 attempts, we will release your abnormal result to your mychart with instructions.  - All results will be automatically released in mychart after 1 week.  - Your provider will provide you with explanation and instruction on all relevant material in your results. Please keep in mind, results and labs may appear confusing or abnormal to the untrained eye, but it does not mean they are actually abnormal for you personally. If you have any questions about your results that are not covered, or you desire more detailed explanation than what was provided, you should make an appointment with your provider to do so.   Our office handles many outgoing and incoming calls daily. If we have not contacted you within 1 week about your results, please check your mychart to see if there is a message first and if not, then contact our office.  In helping with this matter, you help decrease call volume,  and therefore allow us to be able to respond to patients needs more efficiently.   Acute office visits (sick visit):  An acute visit is intended for a new problem and are scheduled in shorter time slots to allow schedule openings for patients with new problems. This is the appropriate visit to discuss a new problem. In order to provide you with excellent quality medical care with proper time for you to explain your problem, have an exam and receive treatment with instructions, these appointments should be limited to one new problem per visit. If you experience a  new problem, in which you desire to be addressed, please make an acute office visit, we save openings on the schedule to accommodate you. Please do not save your new problem for any other type of visit, let us take care of it properly and quickly for you.   Follow up visits:  Depending on your condition(s) your provider will need to see you routinely in order to provide you with quality care and prescribe medication(s). Most chronic conditions (Example: hypertension, Diabetes, depression/anxiety... etc), require visits a couple times a year. Your provider will instruct you on proper follow up for your personal medical conditions and history. Please make certain to make follow up appointments for your condition as instructed. Failing to do so could result in lapse in your medication treatment/refills. If you request a refill, and are overdue to be seen on a condition, we will always provide you with a 30 day script (once) to allow you time to schedule.    Medicare wellness (well visit): - we have a wonderful Nurse Selena Batten(Kim), that will meet with you and provide you will yearly medicare wellness visits. These visits should occur yearly (can not be scheduled less than 1 calendar year apart) and cover preventive health, immunizations, advance directives and screenings you are entitled to yearly through your medicare benefits. Do not miss out on your entitled benefits, this is when medicare will pay for these benefits to be ordered for you.  These are strongly encouraged by your provider and is the appropriate type of visit to make certain you are up to date with all preventive health benefits. If you have not had your medicare wellness exam in the last 12 months, please make certain to schedule one by calling the office and schedule your medicare wellness with Selena BattenKim as soon as possible.   Yearly physical (well visit):  - Adults are recommended to be seen yearly for physicals. Check with your insurance and date of  your last physical, most insurances require one calendar year between physicals. Physicals include all preventive health topics, screenings, medical exam and labs that are appropriate for gender/age and history. You may have fasting labs needed at this visit. This is a well visit (not a sick visit), new problems should not be covered during this visit (see acute visit).  - Pediatric patients are seen more frequently when they are younger. Your provider will advise you on well child visit timing that is appropriate for your their age. - This is not a medicare wellness visit. Medicare wellness exams do not have an exam portion to the visit. Some medicare companies allow for a physical, some do not allow a yearly physical. If your medicare allows a yearly physical you can schedule the medicare wellness with our nurse Selena BattenKim and have your physical with your provider after, on the same day. Please check with insurance for your full benefits.   Late Policy/No Shows:  -  all new patients should arrive 15-30 minutes earlier than appointment to allow [place] time  to  obtain all personal demographics,  insurance information and for you to complete office paperwork. - All established patients should arrive 10-15 minutes earlier than appointment time to update all information and be checked in .  - In our best efforts to run on time, if you are late for your appointment you will be asked to either reschedule or if able, we will work you back into the schedule. There will be a wait time to work you back in the schedule,  depending on availability.  - If you are unable to make it to your appointment as scheduled, please call 24 hours ahead of time to allow [place] to fill the time slot with someone else who needs to be seen. If you do not cancel your appointment ahead of time, you may be charged a no show fee.

## 2017-03-17 NOTE — Progress Notes (Signed)
Patient ID: Erika Gardner, female   DOB: 1975-12-04, 41 y.o.   MRN: [id number]   Subjective:    Patient ID: Erika Gardner, female    DOB: Nov 23, 1975, 41 y.o.   MRN: [id number]  Chief Complaint  Patient presents with  . Anxiety    HPI  Anxiety/insomnia: Pt presents for anxiety follow up. She reports she is going through more stress than usual, not sleeping as well, more fatigued. She and her husband are having some issues, it is the year mark of her father's death. She had ol d ativan script and took one before bed and it did help her sleep better. Without the medication she is waking through out the night with anxiety. She is wondering if her Vit d or b12 levels have something to do with her fatigue. Both of which were checked in the last 6 months and looked great, she has continued the same supplements daily. She reports compliance with Lexapro 10 mg QD. She did try to take herself off the medication, but realized she did need it and restarted the 10 mg dose. Depression screen [facility] 2/9 03/17/2017 10/21/2016  Decreased Interest 0 0  Down, Depressed, Hopeless 0 0  PHQ - 2 Score 0 0  Altered sleeping 3 -  Tired, decreased energy 3 -  Change in appetite 1 -  Feeling bad or failure about yourself  0 -  Trouble concentrating 2 -  Moving slowly or fidgety/restless 0 -  Suicidal thoughts 0 -  PHQ-9 Score 9 -   GAD 7 : Generalized Anxiety Score 03/17/2017  Nervous, Anxious, on Edge 3  Control/stop worrying 1  Worry too much - different things 1  Trouble relaxing 0  Restless 0  Easily annoyed or irritable 0  Afraid - awful might happen 0  Total GAD 7 Score 5    Past Medical History:  Diagnosis Date  . Abnormal mammogram of right breast 09/2012   stable hypoechoic lesion  . Anxiety   . Asthma    somewhat sounding like exercise induced relative to temperature   . B12 deficiency   . Carpal tunnel syndrome    both wrists  . Complication of anesthesia    pt. reports that the  combination of morphine & / or dilaudid with anesthesia  presents with itching    . Fibromyalgia   . GERD (gastroesophageal reflux disease)   . H/O exercise stress test 03/10/2017   Ef 60-65%, no ischemic changes.   . Hematuria   . Hemochromatosis   . Insomnia   . Neuromuscular disorder (HCC)   . Panic attacks   . Seizures (HCC)    x2 with a syncopal episode (03/2015- last episode)  . Sleep apnea    not needing treatment, through a sleep study done showing that if she is on her back she has slight sleep apnea.    . Snoring   . Thyroid disease    multinodular goiter; followed for > 5 years, unchanged. No further imaging needed 09/2016.  Marland Kitchen Vertigo   . Vitamin D deficiency    Allergies  Allergen Reactions  . Savella [Milnacipran Hcl] Other (See Comments)    MOODY ANGRY PALPITATIONS  . Hydromorphone Hcl Itching    Review of Systems Negative, with the exception of above mentioned in HPI     Objective:   Physical Exam BP 112/82 (BP Location: Left Arm, Patient Position: Sitting, Cuff Size: Normal)   Pulse 79   Temp 98.5 F (36.9 C)  Resp 20   Ht 5\' 5"  (1.651 m)   Wt 161 lb 8 oz (73.3 kg)   LMP 03/13/2017   SpO2 97%   BMI 26.88 kg/m  Gen: Afebrile. No acute distress.  CV: RRR Chest: CTAB, no wheeze or crackles Neuro: Normal gait. PERLA. EOMi. Alert. Oriented.  Psych: Normal affect, dress and demeanor. Normal speech. Normal thought content and judgment.    Assessment & Plan:  Erika Gardner is 41 y.o. female present for follow-up Generalized anxiety disorder/insomnia - uncontrolled anxiety with new onset insomnia - continue lexapro 10 mg QD.  - start trazodone taper 1 hour prior to bed. (50-100) taper instructions provided  - if absolutely needed, could prescribed ativan. Would like to refrain from controlled substance use, unless absolutely needed. Pt is in agreement.  - follow up in 4-6 weeks   > 25 minutes spent with patient, >50% of time spent face to face  counseling patient   Electronically Signed by: Felix Pacinienee Urijah Arko, DO [facility]- [place]

## 2017-04-06 IMAGING — MR MR LUMBAR SPINE WO/W CM
4 of 7 series · 18 of 48 positions shown · IV contrast (Yes)
Comparison: Prior radiograph from 05/21/2016 as well as previous
MRI from 02/13/2016.

CLINICAL DATA: Evaluation for the postoperative nerve pain with
numbness in bilateral legs and SI joints. Pain in lower back.
History of prior surgery in Saturday April, 2016.

EXAM:
MRI LUMBAR SPINE WITHOUT AND WITH CONTRAST
TECHNIQUE: Multiplanar and multiecho pulse sequences of the lumbar spine were
obtained without and with intravenous contrast.
CONTRAST:  14mL MULTIHANCE GADOBENATE DIMEGLUMINE 529 MG/ML IV SOLN

[Series 4: T1 · sagittal · 4.0mm · 0.51mm/px · 3 of 15 slices shown (1 of 2)]
[im 1/15]
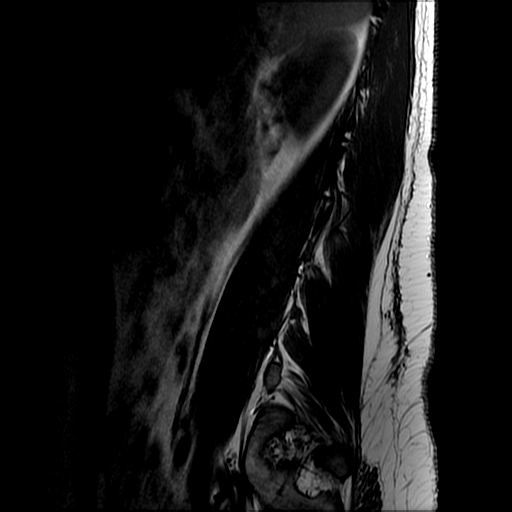
[im 8/15]
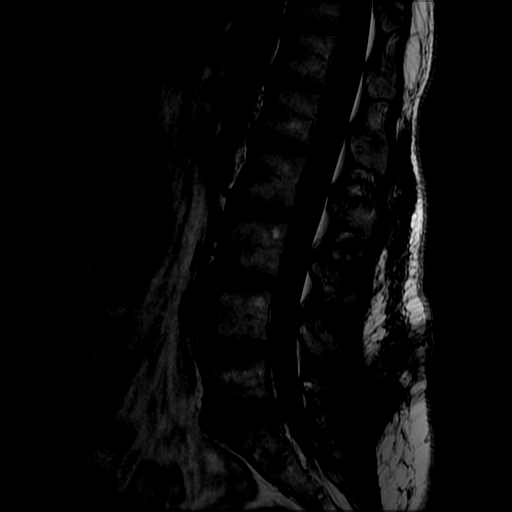
[im 15/15]
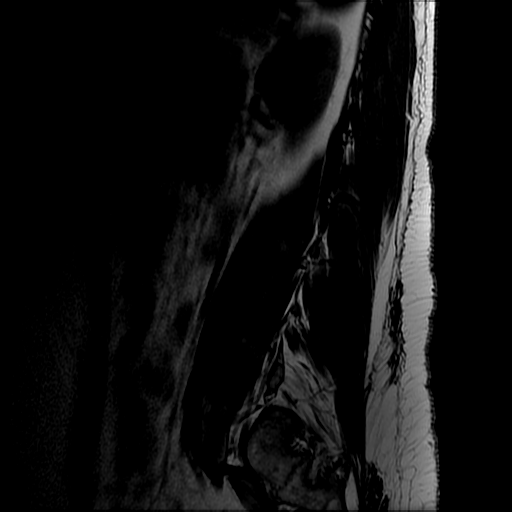

[Series 6: T2 · axial · 4.0mm · 0.39mm/px · z∈[-160,+23]mm · 8 of 35 slices shown]
[im 1/35]
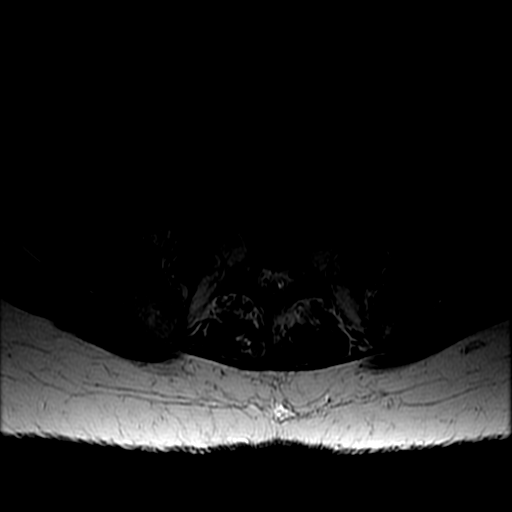
[im 4/35]
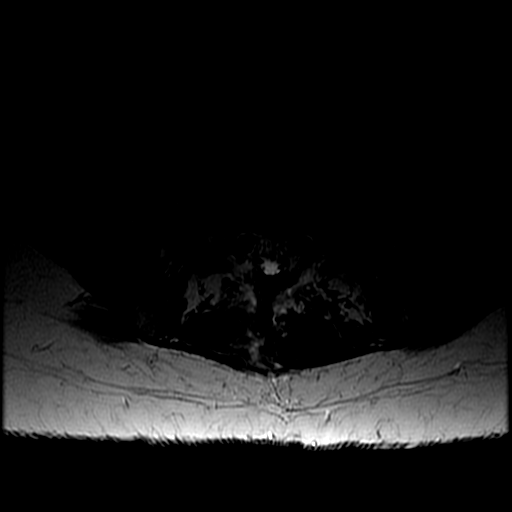
[im 12/35]
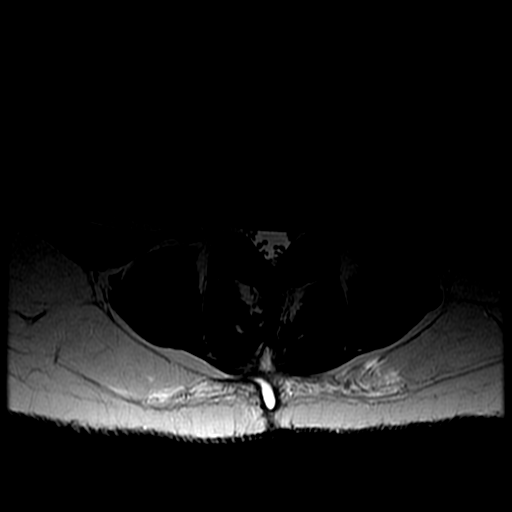
[im 16/35]
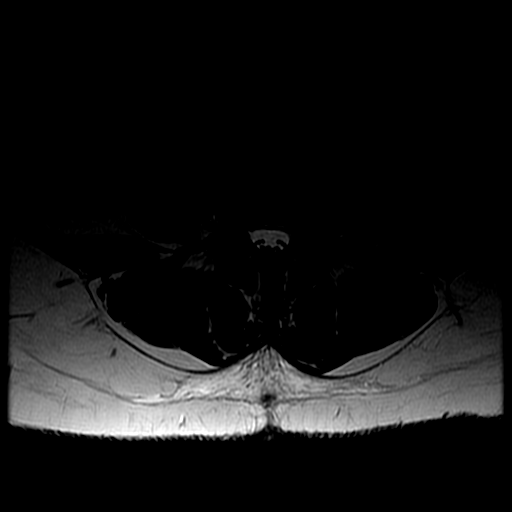
[im 19/35]
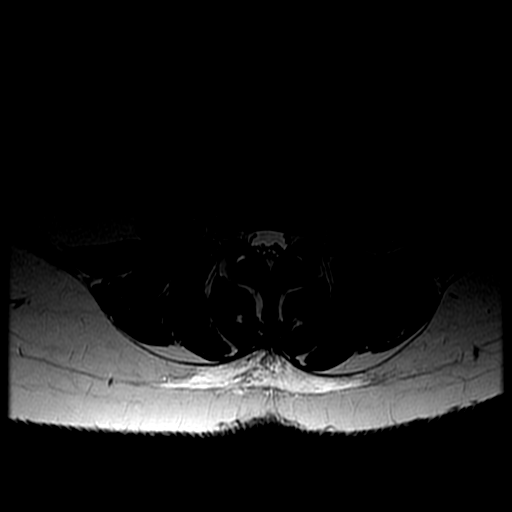
[im 23/35]
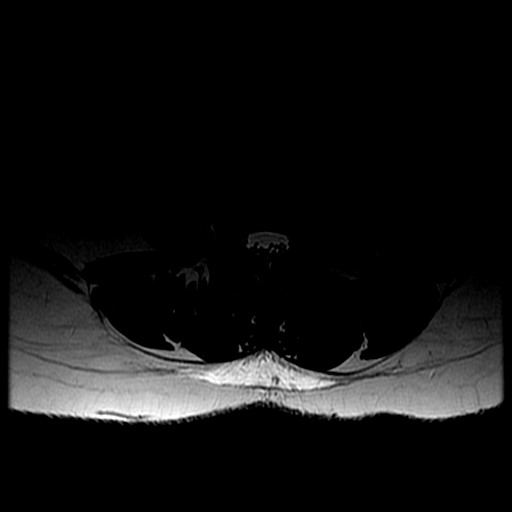
[im 31/35]
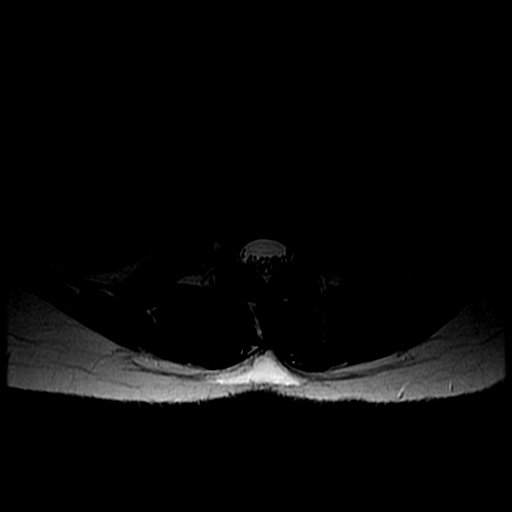
[im 35/35]
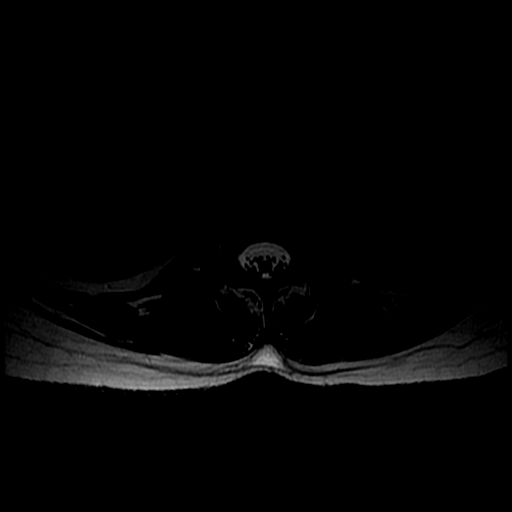

[Series 7: T1 · axial · 4.0mm · 0.39mm/px · z∈[-145,+3]mm · 3 of 35 slices shown (2 of 2)]
[im 4/35]
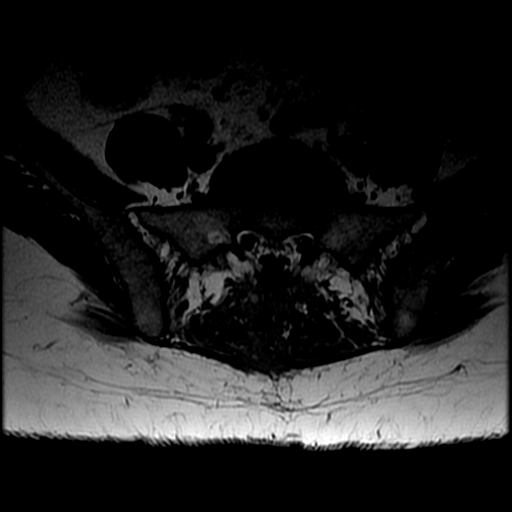
[im 19/35]
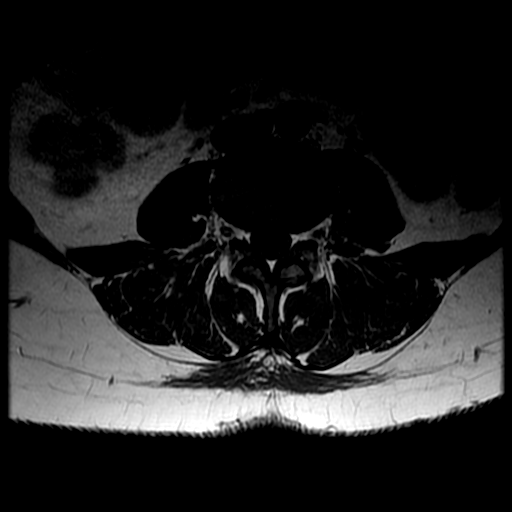
[im 31/35]
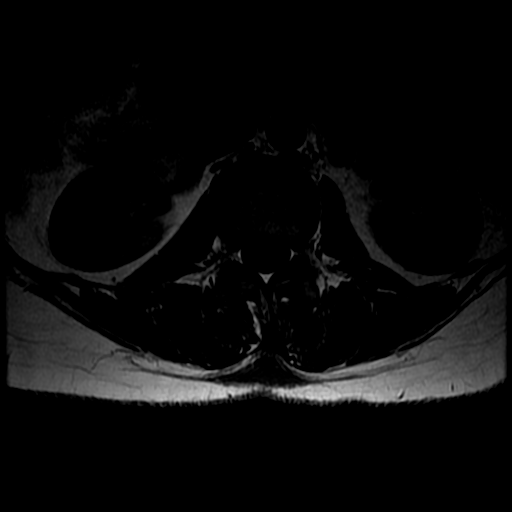

[Series 8: T2 post-contrast · sagittal · 4.0mm · 0.51mm/px · 4 of 15 slices shown]
[im 1/15]
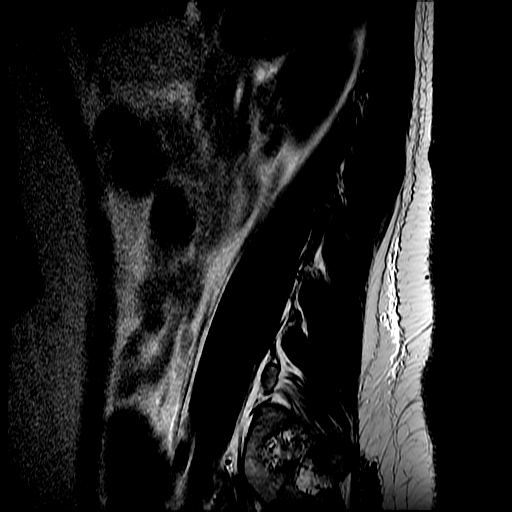
[im 5/15]
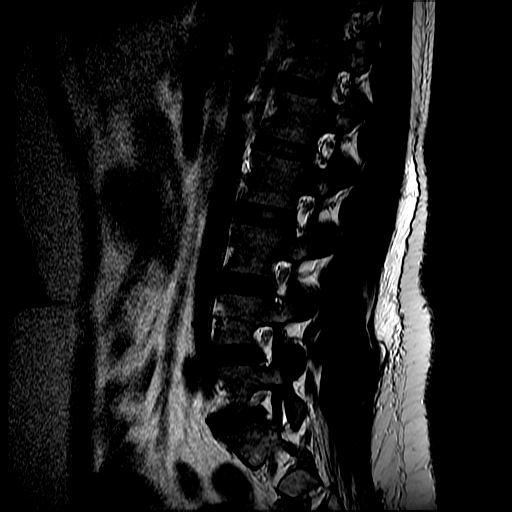
[im 10/15]
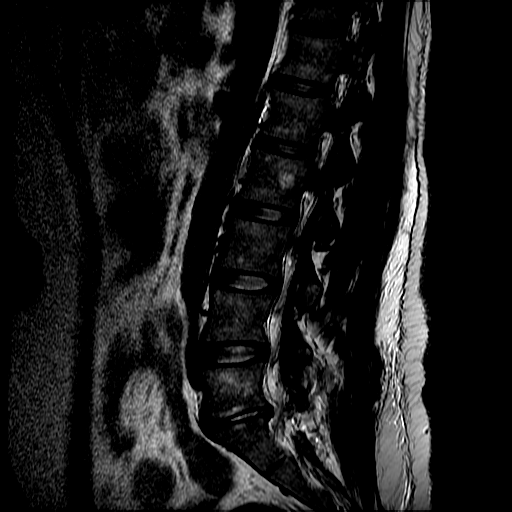
[im 15/15]
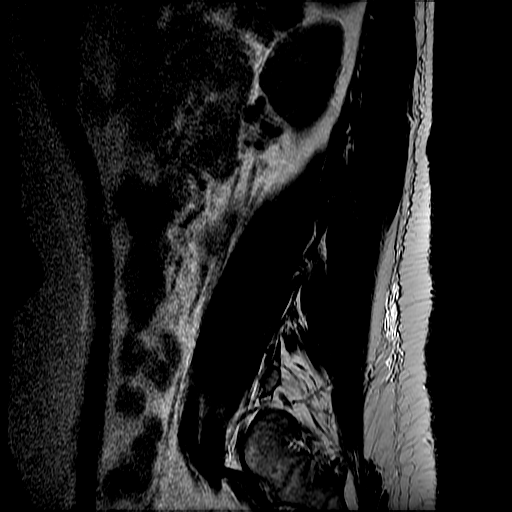

[18 of 48 positions shown; findings below may reference images not displayed]

FINDINGS: Segmentation: Normal segmentation. Lowest well-formed disc is
labeled the L5-S1 level.

Alignment: Vertebral bodies are normally aligned with preservation
of the normal lumbar lordosis. No listhesis.

Vertebrae: Vertebral body heights are preserved. Postoperative
changes from interval decompressive laminectomy with discectomy
present at L5-S1. Mild reactive endplate changes about the L5-S1
interspace, similar to prior. Signal intensity within the vertebral
body bone marrow normal. Few small benign hemangiomas noted.

Conus medullaris: Extends to the L1-2 level and appears normal.

Paraspinal and other soft tissues: Postoperative changes related to
recent posterior decompression at L5-S1. Small residual T2
hyperintense collection along the midline incision measures 0.7 x
1.6 x 4.0 cm (series 6, image 27). This most likely reflects a
resolving postoperative seroma. No subfascial extension. Paraspinous
soft tissues otherwise normal.

Disc levels:

No significant degenerative changes are seen through the L3-4 level.

L4-5: No significant disc bulge or disc protrusion. Mild ligamentum
flavum hypertrophy without stenosis.

L5-S1: Postoperative changes from recent bilateral decompressive
laminectomy with discectomy are seen. Thecal sac is widely patent.
No recurrent disc herniation. Hyperintense STIR signal intensity
with enhancement within the posterior L5-S1 disc most likely polyps
operative in nature, although early infection could also have this
appearance. Enhancing granulation tissue within the bilateral and
posterior epidural space present, surrounding the bilateral S1 nerve
roots in the lateral recesses without displacement or compression.
No significant foraminal encroachment.
IMPRESSION: 1. Postoperative changes from recent decompressive laminectomy with
discectomy at L5-S1 without complication. Small amount of
postoperative enhancing granulation tissue/fibrosis abuts the
bilateral S1 nerve roots in the lateral recesses, and could
potentially result in lower extremity radicular symptoms.
Hyperintense STIR signal intensity with enhancement within the
posterior L5-S1 disc felt to most certainly be postoperative in
nature, although clinical correlation for possible infection is
recommended as this is not entirely excluded.
2. Stable ligamentum flavum hypertrophy at L4-5 without stenosis.

## 2017-05-21 ENCOUNTER — Encounter: Payer: Self-pay | Admitting: Family Medicine

## 2017-05-21 ENCOUNTER — Ambulatory Visit (INDEPENDENT_AMBULATORY_CARE_PROVIDER_SITE_OTHER): Payer: 59 | Admitting: Family Medicine

## 2017-05-21 ENCOUNTER — Other Ambulatory Visit (HOSPITAL_COMMUNITY)
Admission: RE | Admit: 2017-05-21 | Discharge: 2017-05-21 | Disposition: A | Discharge status: Home / Self Care | Payer: 59 | Source: Ambulatory Visit | Attending: Family Medicine | Admitting: Family Medicine

## 2017-05-21 VITALS — BP 119/81 | HR 78 | Temp 98.0°F | Resp 20 | Wt 156.5 lb

## 2017-05-21 DIAGNOSIS — Z7689 Persons encountering health services in other specified circumstances: Secondary | ICD-10-CM | POA: Diagnosis not present

## 2017-05-21 DIAGNOSIS — F419 Anxiety disorder, unspecified: Secondary | ICD-10-CM | POA: Insufficient documentation

## 2017-05-21 DIAGNOSIS — N898 Other specified noninflammatory disorders of vagina: Secondary | ICD-10-CM | POA: Diagnosis not present

## 2017-05-21 DIAGNOSIS — Z202 Contact with and (suspected) exposure to infections with a predominantly sexual mode of transmission: Secondary | ICD-10-CM | POA: Insufficient documentation

## 2017-05-21 DIAGNOSIS — M797 Fibromyalgia: Secondary | ICD-10-CM | POA: Insufficient documentation

## 2017-05-21 DIAGNOSIS — J45909 Unspecified asthma, uncomplicated: Secondary | ICD-10-CM | POA: Insufficient documentation

## 2017-05-21 DIAGNOSIS — E538 Deficiency of other specified B group vitamins: Secondary | ICD-10-CM | POA: Diagnosis not present

## 2017-05-21 DIAGNOSIS — K219 Gastro-esophageal reflux disease without esophagitis: Secondary | ICD-10-CM | POA: Insufficient documentation

## 2017-05-21 DIAGNOSIS — E559 Vitamin D deficiency, unspecified: Secondary | ICD-10-CM | POA: Diagnosis not present

## 2017-05-21 MED ORDER — CEFTRIAXONE SODIUM 500 MG IJ SOLR
250.0000 mg | Freq: Once | INTRAMUSCULAR | Status: AC
  Administered 2017-05-21: 250 mg via INTRAMUSCULAR

## 2017-05-21 MED ORDER — AZITHROMYCIN 500 MG PO TABS: ORAL_TABLET | ORAL | 0 refills | Status: DC

## 2017-05-21 MED ORDER — FLUCONAZOLE 150 MG PO TABS: 150.0000 mg | ORAL_TABLET | Freq: Once | ORAL | 0 refills | Status: AC

## 2017-05-21 NOTE — Patient Instructions (Addendum)
Diflucan prescribed, sent to pharmacy. Azith 2 tabs taken at same time with a meal, if you are called with a positive test. This script is printed for you. Rocephin shot today.     Cervicitis Cervicitis is when the cervix gets irritated and swollen. Your cervix is the lower end of your uterus. Follow these instructions at home:  Do not have sex until your doctor says it is okay.  Take over-the-counter and prescription medicines only as told by your doctor.  If you were prescribed an antibiotic medicine, take it as told by your doctor. Do not stop taking it even if you start to feel better.  Keep all follow-up visits as told by your doctor. This is important. Contact a doctor if:  Your symptoms come back after treatment.  Your symptoms get worse after treatment.  You have a fever.  You feel tired (fatigued).  Your belly (abdomen) hurts.  You feel like you are going to throw up (are nauseous).  You throw up (vomit).  You have watery poop (diarrhea).  Your back hurts. Get help right away if:  You have very bad pain in your belly, and medicine does not help it.  You cannot pee (urinate). Summary  Cervicitis is when the cervix gets irritated and swollen.  Do not have sex until your doctor says it is okay.  If you need to take an antibiotic, do not stop taking even if you start to feel better. Take medicines only as told by your doctor. This information is not intended to replace advice given to you by your health care provider. Make sure you discuss any questions you have with your health care provider. Document Released: 06/25/2008 Document Revised: 06/02/2016 Document Reviewed: 06/02/2016 Elsevier Interactive Patient Education  2017 [employer].

## 2017-05-21 NOTE — Progress Notes (Signed)
Erika Gardner , [birth year], 41 y.o., female MRN: [id number] Patient Care Team    Relationship Specialty Notifications Start End  Natalia Leatherwood, DO PCP - General Family Medicine  06/01/15   Alfredo Martinez, MD Consulting Physician Urology  09/12/16   Cox, Ronnald Collum, MD Referring Physician Nurse Practitioner  10/21/16    Comment: Gynecology  Ivery Quale., DPM  Podiatry  10/21/16   Hilda Lias, MD Consulting Physician Neurosurgery  10/21/16   Benjiman Core, MD Consulting Physician Oncology  10/21/16   Glenis Smoker., MD Referring Physician Surgery  10/21/16   Specialists, Digestive Health  Gastroenterology  10/21/16   Tanya Nones, OD  Optometry  01/02/17     Chief Complaint  Patient presents with  . Vaginal Itching    discharge and burning      Subjective: Pt presents for an OV with complaints of concern for STD exposure. She has recently separated from her husband and feels he likely cheated on her and she is concerned for STD exposure with the onset of her vaginal discharge and irritation. She has noticed some mild white discharge and brownish discharge. She denies fever, chills, worsening abd pain, dysuria or diarrhea.  She is spotting, but she did a continued dose of her BCP to accommodate for a trip she had and need use the placebo week, she has not routinely done this in the past.   Depression screen [facility] 2/9 03/17/2017 10/21/2016  Decreased Interest 0 0  Down, Depressed, Hopeless 0 0  PHQ - 2 Score 0 0  Altered sleeping 3 -  Tired, decreased energy 3 -  Change in appetite 1 -  Feeling bad or failure about yourself  0 -  Trouble concentrating 2 -  Moving slowly or fidgety/restless 0 -  Suicidal thoughts 0 -  PHQ-9 Score 9 -    Allergies  Allergen Reactions  . Savella [Milnacipran Hcl] Other (See Comments)    MOODY ANGRY PALPITATIONS  . Hydromorphone Hcl Itching   Social History  Substance Use Topics  . Smoking status: Never Smoker  . Smokeless  tobacco: Never Used  . Alcohol use Yes     Comment: socially- occasional- 4-5 x in a month     Past Medical History:  Diagnosis Date  . Abnormal mammogram of right breast 09/2012   stable hypoechoic lesion  . Anxiety   . Asthma    somewhat sounding like exercise induced relative to temperature   . B12 deficiency   . Carpal tunnel syndrome    both wrists  . Complication of anesthesia    pt. reports that the combination of morphine & / or dilaudid with anesthesia  presents with itching    . Fibromyalgia   . GERD (gastroesophageal reflux disease)   . H/O exercise stress test 03/10/2017   Ef 60-65%, no ischemic changes.   . Hematuria   . Hemochromatosis   . Insomnia   . Neuromuscular disorder (HCC)   . Panic attacks   . Seizures (HCC)    x2 with a syncopal episode (03/2015- last episode)  . Sleep apnea    not needing treatment, through a sleep study done showing that if she is on her back she has slight sleep apnea.    . Snoring   . Thyroid disease    multinodular goiter; followed for > 5 years, unchanged. No further imaging needed 09/2016.  Marland Kitchen Vertigo   . Vitamin D deficiency    Past Surgical History:  Procedure Laterality Date  . APPENDECTOMY    . CHOLECYSTECTOMY  2014  . DILATION AND CURETTAGE OF UTERUS  2007   Post miscarriage  . HERNIA REPAIR     umbilicus  . KNEE ARTHROSCOPY Right 2014   wear and tear  . LUMBAR LAMINECTOMY/DECOMPRESSION MICRODISCECTOMY Bilateral 05/10/2016   Procedure: Bilateral Lumbar five-sacral one Microdiskectomy;  Surgeon: Hilda Lias, MD;  Location: [facility];  Service: Neurosurgery;  Laterality: Bilateral;  bilateral  . VAGINAL DELIVERY     x3   Family History  Problem Relation Age of Onset  . Arthritis Maternal Grandmother   . Hearing loss Maternal Grandmother   . Cancer Maternal Grandfather        skin  . Heart disease Maternal Grandfather   . Arthritis Paternal Grandmother   . Cancer Paternal Grandfather        kidney  . Heart  disease Paternal Grandfather   . Kidney disease Paternal Grandfather    Allergies as of 05/21/2017      Reactions   Savella [milnacipran Hcl] Other (See Comments)   MOODY ANGRY PALPITATIONS   Hydromorphone Hcl Itching      Medication List       Accurate as of 05/21/17  9:42 AM. Always use your most recent med list.          albuterol 108 (90 Base) MCG/ACT inhaler Commonly known as:  PROVENTIL HFA;VENTOLIN HFA Inhale 2 puffs into the lungs every 6 (six) hours as needed for wheezing or shortness of breath.   azithromycin 500 MG tablet Commonly known as:  ZITHROMAX 1000 mg once   escitalopram 10 MG tablet Commonly known as:  LEXAPRO Take 1 tablet (10 mg total) by mouth at bedtime.   fluconazole 150 MG tablet Commonly known as:  DIFLUCAN Take 1 tablet (150 mg total) by mouth once.   norethindrone-ethinyl estradiol 1-20 MG-MCG tablet Commonly known as:  JUNEL FE,GILDESS FE,LOESTRIN FE Take 1 tablet by mouth at bedtime.   omeprazole 20 MG capsule Commonly known as:  PRILOSEC Take 2 capsules (40 mg total) by mouth daily.   ranitidine 150 MG tablet Commonly known as:  ZANTAC Take 1 tablet (150 mg total) by mouth 2 (two) times daily.   traZODone 50 MG tablet Commonly known as:  DESYREL Take 1-2 tablets (50-100 mg total) by mouth at bedtime.   vitamin B-12 1000 MCG tablet Commonly known as:  CYANOCOBALAMIN Take 1 tablet (1,000 mcg total) by mouth daily.   Vitamin D3 1000 units Caps Take 3,000 Units by mouth.            Discharge Care Instructions        Start     Ordered   05/21/17 0945  CEFTRIAXONE SODIUM 500 MG IJ SOLR   Once     05/21/17 0939   05/21/17 0000  RPR     05/21/17 0909   05/21/17 0000  HIV antibody (with reflex)     05/21/17 0909   05/21/17 0000  azithromycin (ZITHROMAX) 500 MG tablet     05/21/17 0926   05/21/17 0000  fluconazole (DIFLUCAN) 150 MG tablet   Once     05/21/17 0926   05/21/17 0000  Cervicovaginal ancillary only      Question Answer Comment  Specimen Source Endocervix/Cervix   Ancillary Testing GC/Chlamydia   Ancillary Testing BD Affirm      05/21/17 0939      All past medical history, surgical history, allergies, family history, immunizations andmedications were  updated in the EMR today and reviewed under the history and medication portions of their EMR.     ROS: Negative, with the exception of above mentioned in HPI   Objective:  BP 119/81 (BP Location: Left Arm, Patient Position: Sitting, Cuff Size: Normal)   Pulse 78   Temp 98 F (36.7 C)   Resp 20   Wt 156 lb 8 oz (71 kg)   LMP 04/07/2017   SpO2 98%   BMI 26.04 kg/m  Body mass index is 26.04 kg/m. Gen: Afebrile. No acute distress. Nontoxic in appearance, well developed, well nourished.  HENT: AT. [place].MMM, no oral lesions.  Eyes:Pupils Equal Round Reactive to light, Extraocular movements intact,  Conjunctiva without redness, discharge or icterus. Abd: Soft. flat. ND. Mild lower pelvis tenderness (her chronic).BS present. no Masses palpated. No rebound or guarding.  GYN:  External genitalia within normal limits, normal hair distribution, no lesions. Urethral meatus normal, no lesions. Vaginal mucosa pink, moist, normal rugae, no lesions. No cystocele or rectocele. cervix with erythema and friable. Mild greenish discharge coming from cervical OS. l Bimanual exam revealed normal uterus.  No bladder/suprapubic fullness, masses or tenderness. No cervical motion tenderness. No adnexal fullness. Anus and perineum within normal limits, no lesions.   No exam data present No results found. No results found for this or any previous visit (from the past 24 hour(s)).  Assessment/Plan: Ramiah Helfrich is a 41 y.o. female present for OV for  Encounter for assessment of STD exposure - RPR - HIV antibody (with reflex) Vaginal discharge/Vaginal irritation - Discussed treatment options with her today given her positive findings on exam concerning  for cervicitis.  - Pt opted for Rocephin 250 mg IM today  Azith 1000 mg script printed. Pt will fill if positive chlamydia.  - G/C and wet prep collected and sent.  - Diflucan x1 prescribed.  - Pt will be called once all labs resulted.  - F/u PRN   Reviewed expectations re: course of current medical issues.  Discussed self-management of symptoms.  Outlined signs and symptoms indicating need for more acute intervention.  Patient verbalized understanding and all questions were answered.  Patient received an After-Visit Summary.    Orders Placed This Encounter  Procedures  . RPR  . HIV antibody (with reflex)    Note is dictated utilizing voice recognition software. Although note has been proof read prior to signing, occasional typographical errors still can be missed. If any questions arise, please do not hesitate to call for verification.   electronically signed by:  Felix Pacini, DO  [facility] [place]

## 2017-05-22 LAB — CERVICOVAGINAL ANCILLARY ONLY
CHLAMYDIA, DNA PROBE: NEGATIVE
NEISSERIA GONORRHEA: NEGATIVE
Wet Prep (BD Affirm): POSITIVE — AB

## 2017-05-22 LAB — HIV ANTIBODY (ROUTINE TESTING W REFLEX): HIV 1&2 Ab, 4th Generation: NONREACTIVE

## 2017-05-22 LAB — RPR

## 2017-05-23 ENCOUNTER — Telehealth: Payer: Self-pay | Admitting: Family Medicine

## 2017-05-23 NOTE — Telephone Encounter (Signed)
Patient requesting call back with lab results.

## 2017-05-23 NOTE — Telephone Encounter (Signed)
Spoke with patient let her know results are not available yet we will call her when results are reviewed by Dr Claiborne Billings.

## 2017-05-26 ENCOUNTER — Telehealth: Payer: Self-pay | Admitting: Family Medicine

## 2017-05-26 NOTE — Telephone Encounter (Signed)
Left message for patient with results and instructions per [facility]

## 2017-05-26 NOTE — Telephone Encounter (Signed)
Left message with results and instructions on patient voice mail per [facility] 

## 2017-05-26 NOTE — Telephone Encounter (Signed)
Please call pt:  all her test were negative/normal, with the exception of presence of yeast infection. Diflucan had been prescribed. No need for the azith prescription printed.

## 2017-05-28 ENCOUNTER — Telehealth: Payer: Self-pay | Admitting: Family Medicine

## 2017-05-28 MED ORDER — FLUCONAZOLE 150 MG PO TABS: ORAL_TABLET | ORAL | 0 refills | Status: DC

## 2017-05-28 NOTE — Telephone Encounter (Signed)
Called in diflucan for  her

## 2017-05-28 NOTE — Telephone Encounter (Signed)
Patient notified

## 2017-05-28 NOTE — Telephone Encounter (Signed)
Patient states she was seen for yeast infection last week and her symptoms have returned.  She wants to know if another rx can be sent in for her or if there is something otc she can take.    Pharmacy:  [id number] - [place], [place] - [address] [phone] (Phone) [phone] (Fax)

## 2017-06-23 ENCOUNTER — Encounter: Payer: Self-pay | Admitting: Family Medicine

## 2017-06-23 ENCOUNTER — Ambulatory Visit (INDEPENDENT_AMBULATORY_CARE_PROVIDER_SITE_OTHER): Payer: 59 | Admitting: Family Medicine

## 2017-06-23 VITALS — BP 121/82 | HR 73 | Temp 98.7°F | Resp 20 | Wt 154.5 lb

## 2017-06-23 DIAGNOSIS — K121 Other forms of stomatitis: Secondary | ICD-10-CM

## 2017-06-23 DIAGNOSIS — R0981 Nasal congestion: Secondary | ICD-10-CM | POA: Diagnosis not present

## 2017-06-23 DIAGNOSIS — G4700 Insomnia, unspecified: Secondary | ICD-10-CM

## 2017-06-23 MED ORDER — TRAZODONE HCL 50 MG PO TABS: 50.0000 mg | ORAL_TABLET | Freq: Every day | ORAL | 0 refills | Status: DC

## 2017-06-23 MED ORDER — METHYLPREDNISOLONE ACETATE 80 MG/ML IJ SUSP
80.0000 mg | Freq: Once | INTRAMUSCULAR | Status: AC
  Administered 2017-06-23: 80 mg via INTRAMUSCULAR

## 2017-06-23 NOTE — Patient Instructions (Addendum)
Salt water gargles three times a day.  Start flonase nasal spray.  Steroid shot today.  Continue Augmentin.  Add mucinex  Follow up in 2 weeks, I want to make sure that area of ulceration resolves.

## 2017-06-23 NOTE — Progress Notes (Signed)
Erika Gardner , [birth year], 41 y.o., female MRN: [id number] Patient Care Team    Relationship Specialty Notifications Start End  Natalia Leatherwood, DO PCP - General Family Medicine  06/01/15   Alfredo Martinez, MD Consulting Physician Urology  09/12/16   Cox, Ronnald Collum, MD Referring Physician Nurse Practitioner  10/21/16    Comment: Gynecology  Ivery Quale., DPM  Podiatry  10/21/16   Hilda Lias, MD Consulting Physician Neurosurgery  10/21/16   Benjiman Core, MD Consulting Physician Oncology  10/21/16   Glenis Smoker., MD Referring Physician Surgery  10/21/16   Specialists, Digestive Health  Gastroenterology  10/21/16   Tanya Nones, OD  Optometry  01/02/17     Chief Complaint  Patient presents with  . Ear Pain  . Sinusitis     Subjective: Erika Gardner presents for an OV with complaints of ear pain and sinusitis of 2.5 weeks  duration.  Associated symptoms include fatigue, congestion, sinus pressure, right ear pain , dry cough, PND. She denies fever, chills, nausea, vomit or rash. [facility]. She does not feel her symptoms have improved all that greatly. Erika Gardner has tried Sudafed to ease their symptoms.   Insomnia: Erika Gardner was started on trazodone about 6 weeks ago for her new insomnia. She states she is responding to this medication well. She takes 50 mg QHS and is sleeping much better. She would like refills on this today.   Depression screen [facility] 2/9 03/17/2017 10/21/2016  Decreased Interest 0 0  Down, Depressed, Hopeless 0 0  PHQ - 2 Score 0 0  Altered sleeping 3 -  Tired, decreased energy 3 -  Change in appetite 1 -  Feeling bad or failure about yourself  0 -  Trouble concentrating 2 -  Moving slowly or fidgety/restless 0 -  Suicidal thoughts 0 -  PHQ-9 Score 9 -    Allergies  Allergen Reactions  . Savella [Milnacipran Hcl] Other (See Comments)    MOODY ANGRY PALPITATIONS  . Hydromorphone Hcl Itching   Social History    Substance Use Topics  . Smoking status: Never Smoker  . Smokeless tobacco: Never Used  . Alcohol use Yes     Comment: socially- occasional- 4-5 x in a month     Past Medical History:  Diagnosis Date  . Abnormal mammogram of right breast 09/2012   stable hypoechoic lesion  . Anxiety   . Asthma    somewhat sounding like exercise induced relative to temperature   . B12 deficiency   . Carpal tunnel syndrome    both wrists  . Complication of anesthesia    Erika Gardner. reports that the combination of morphine & / or dilaudid with anesthesia  presents with itching    . Fibromyalgia   . GERD (gastroesophageal reflux disease)   . H/O exercise stress test 03/10/2017   Ef 60-65%, no ischemic changes.   . Hematuria   . Hemochromatosis   . Insomnia   . Neuromuscular disorder (HCC)   . Panic attacks   . Seizures (HCC)    x2 with a syncopal episode (03/2015- last episode)  . Sleep apnea    not needing treatment, through a sleep study done showing that if she is on her back she has slight sleep apnea.    . Snoring   . Thyroid disease    multinodular goiter; followed for > 5 years, unchanged. No further imaging needed 09/2016.  Marland Kitchen  Vertigo   . Vitamin D deficiency    Past Surgical History:  Procedure Laterality Date  . APPENDECTOMY    . CHOLECYSTECTOMY  2014  . DILATION AND CURETTAGE OF UTERUS  2007   Post miscarriage  . HERNIA REPAIR     umbilicus  . KNEE ARTHROSCOPY Right 2014   wear and tear  . LUMBAR LAMINECTOMY/DECOMPRESSION MICRODISCECTOMY Bilateral 05/10/2016   Procedure: Bilateral Lumbar five-sacral one Microdiskectomy;  Surgeon: Hilda Lias, MD;  Location: [facility];  Service: Neurosurgery;  Laterality: Bilateral;  bilateral  . VAGINAL DELIVERY     x3   Family History  Problem Relation Age of Onset  . Arthritis Maternal Grandmother   . Hearing loss Maternal Grandmother   . Cancer Maternal Grandfather        skin  . Heart disease Maternal Grandfather   . Arthritis Paternal  Grandmother   . Cancer Paternal Grandfather        kidney  . Heart disease Paternal Grandfather   . Kidney disease Paternal Grandfather    Allergies as of 06/23/2017      Reactions   Savella [milnacipran Hcl] Other (See Comments)   MOODY ANGRY PALPITATIONS   Hydromorphone Hcl Itching      Medication List       Accurate as of 06/23/17  9:08 AM. Always use your most recent med list.          albuterol 108 (90 Base) MCG/ACT inhaler Commonly known as:  PROVENTIL HFA;VENTOLIN HFA Inhale 2 puffs into the lungs every 6 (six) hours as needed for wheezing or shortness of breath.   amoxicillin-clavulanate 875-125 MG tablet Commonly known as:  AUGMENTIN Take 1 tablet by mouth 2 (two) times daily.   escitalopram 10 MG tablet Commonly known as:  LEXAPRO Take 1 tablet (10 mg total) by mouth at bedtime.   norethindrone-ethinyl estradiol 1-20 MG-MCG tablet Commonly known as:  JUNEL FE,GILDESS FE,LOESTRIN FE Take 1 tablet by mouth at bedtime.   omeprazole 20 MG capsule Commonly known as:  PRILOSEC Take 2 capsules (40 mg total) by mouth daily.   ranitidine 150 MG tablet Commonly known as:  ZANTAC Take 1 tablet (150 mg total) by mouth 2 (two) times daily.   traZODone 50 MG tablet Commonly known as:  DESYREL Take 1-2 tablets (50-100 mg total) by mouth at bedtime.   vitamin B-12 1000 MCG tablet Commonly known as:  CYANOCOBALAMIN Take 1 tablet (1,000 mcg total) by mouth daily.   Vitamin D3 1000 units Caps Take 3,000 Units by mouth.       All past medical history, surgical history, allergies, family history, immunizations andmedications were updated in the EMR today and reviewed under the history and medication portions of their EMR.     ROS: Negative, with the exception of above mentioned in HPI   Objective:  BP 121/82 (BP Location: Right Arm, Patient Position: Sitting, Cuff Size: Normal)   Pulse 73   Temp 98.7 F (37.1 C)   Resp 20   Wt 154 lb 8 oz (70.1 kg)   SpO2  99%   BMI 25.71 kg/m  Body mass index is 25.71 kg/m. Gen: Afebrile. No acute distress. Nontoxic in appearance, well developed, well nourished.  HENT: AT. [place]. Bilateral TM visualized without erythema or bulging, mild fullness left. MMM, no oral lesions. Bilateral nares mild erythema, no swelling or drainage. Throat with small demarcated area of erythema right tonsillar area with ulceration in the center, no exudates. PND present. Sinus pressure  and hoarseness present.   Eyes:Pupils Equal Round Reactive to light, Extraocular movements intact,  Conjunctiva without redness, discharge or icterus. Neck/lymp/endocrine: Supple, mild ant cervical lymphadenopathy CV: RRR  Chest: CTAB, no wheeze or crackles. Good air movement, normal resp effort.  Abd: Soft. NTND. BS present.  Skin: no rashes, purpura or petechiae.  Neuro:  Normal gait. PERLA. EOMi. Alert. Oriented x3  No exam data present No results found. No results found for this or any previous visit (from the past 24 hour(s)).  Assessment/Plan: Ednamae Schiano is a 41 y.o. female present for OV for  Congestion of nasal sinus/Ulceration of oral mucosa - discussed exam with Erika Gardner in detail. Erythema and ulceration on right tonsillar area possibly from viral infection. Salt water gargles recommended. Offered lidocaine swish, Erika Gardner does not feel it it that painful currently.  - [facility]x [facility] ([facility]in). Add depo medrol inj today. Start OTC flonase and mucinex. Rest. hydrate - methylPREDNISolone acetate (DEPO-MEDROL) injection 80 mg; Inject 1 mL (80 mg total) into the muscle once. - F/U 2 weeks to make certain ulceration resolves, if not would want to seen to ENT for evaluation. She does have occasional submandibular lymph node on that side on the right, with normal imaging.   Insomnia:  - doing well on trazodone.  - refills provided today to get her to her appt in Dec for depression/anxiety, in which all three issues will  be followed up at same appt.    Reviewed expectations re: course of current medical issues.  Discussed self-management of symptoms.  Outlined signs and symptoms indicating need for more acute intervention.  Patient verbalized understanding and all questions were answered.  Patient received an After-Visit Summary.    No orders of the defined types were placed in this encounter.    Note is dictated utilizing voice recognition software. Although note has been proof read prior to signing, occasional typographical errors still can be missed. If any questions arise, please do not hesitate to call for verification.   electronically signed by:  Felix Pacini, DO  [facility] [place]

## 2017-07-31 IMAGING — US US SOFT TISSUE HEAD/NECK
1 series · 13 of 25 positions shown · non-contrast
Comparison: 06/06/2015, 01/08/2010

CLINICAL DATA: Right lower lobe nodule, follow-up

EXAM:
THYROID ULTRASOUND
TECHNIQUE: Ultrasound examination of the thyroid gland and adjacent soft
tissues was performed.

[Series 1: us soft tissue head/neck · 0.04mm/px · 13 of 28 slices shown]
[im 1/28]
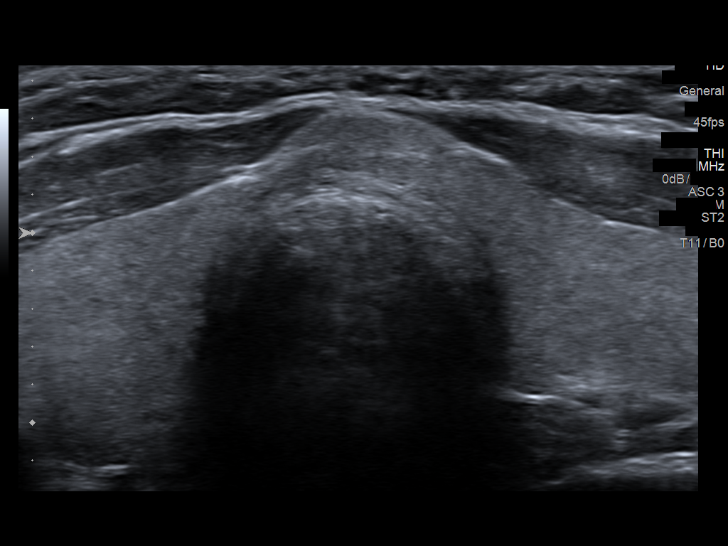
[im 3/28]
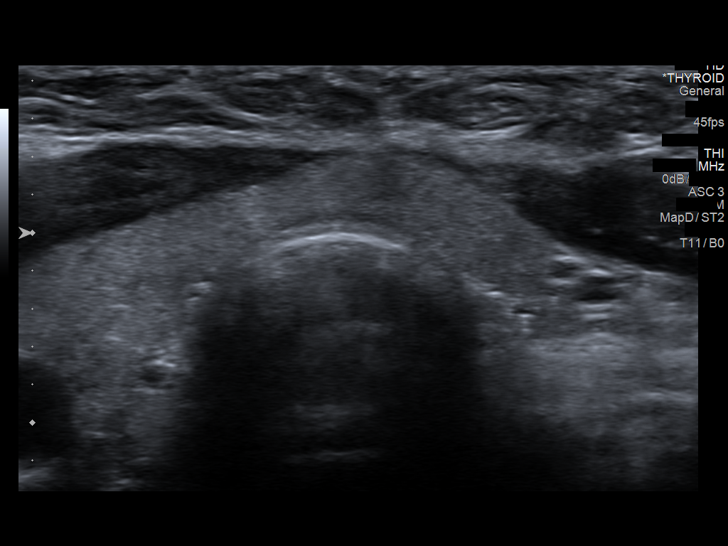
[im 5/28]
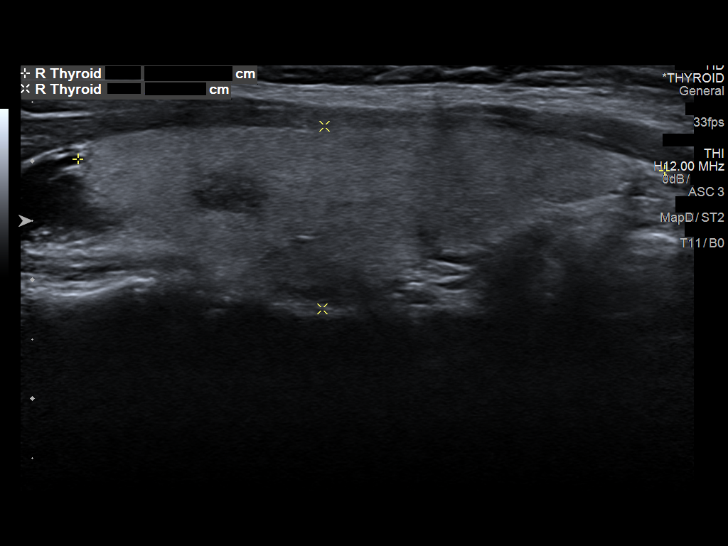
[im 7/28]
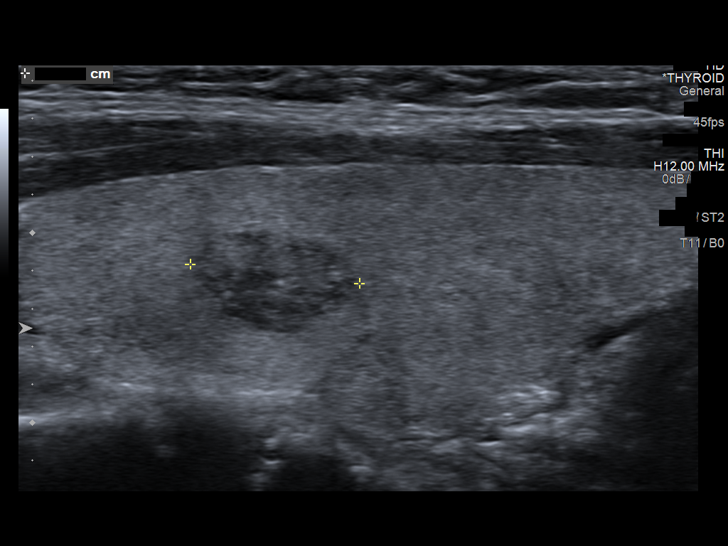
[im 10/28]
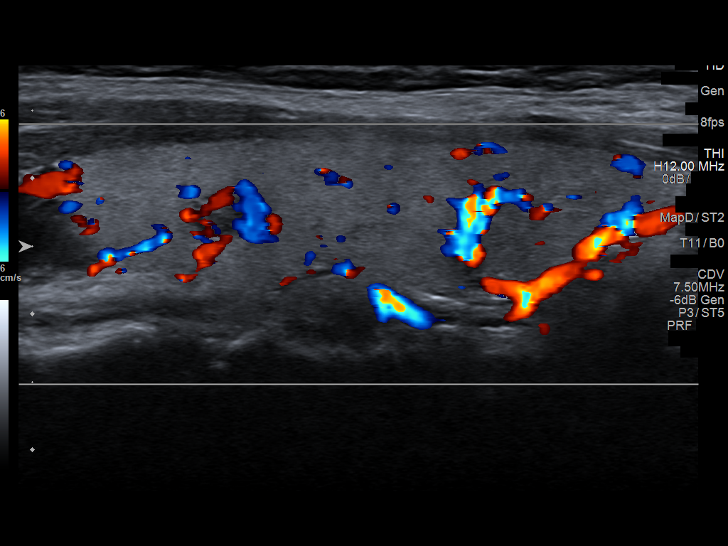
[im 12/28]
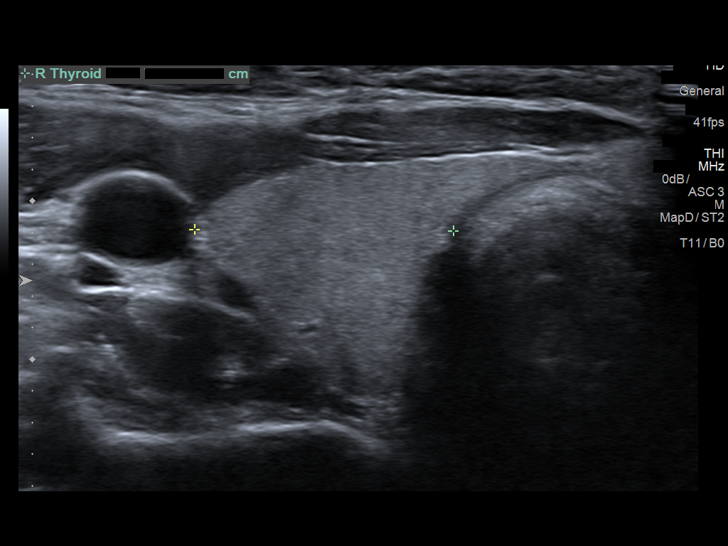
[im 14/28]
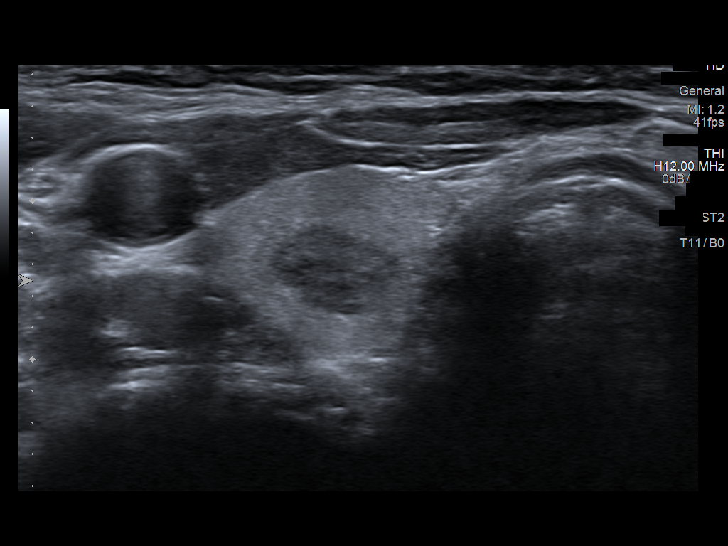
[im 16/28]
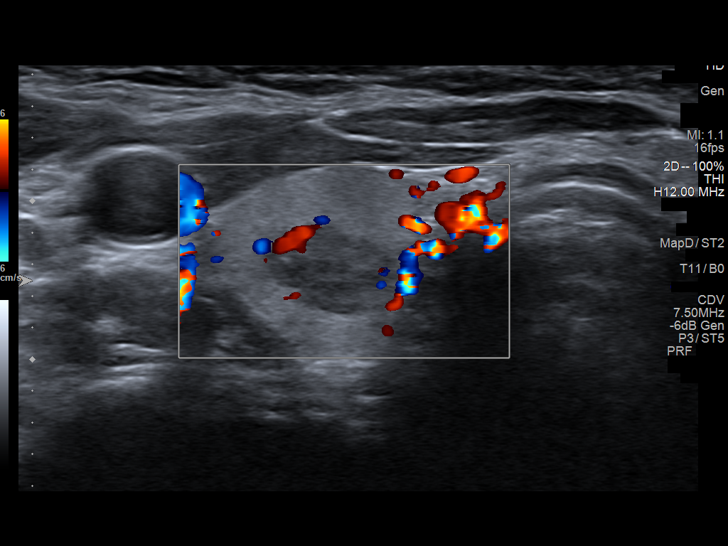
[im 19/28]
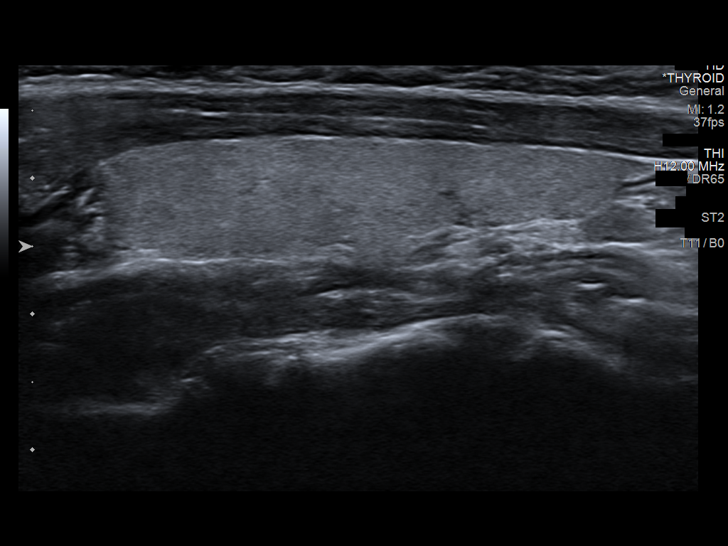
[im 21/28]
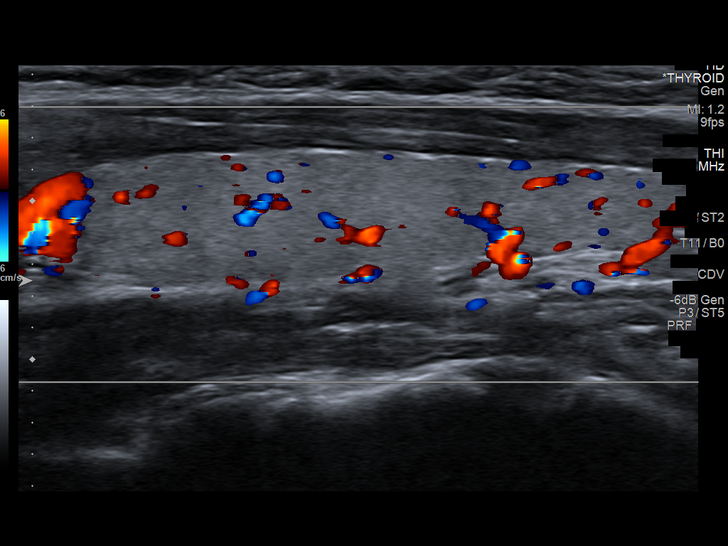
[im 23/28]
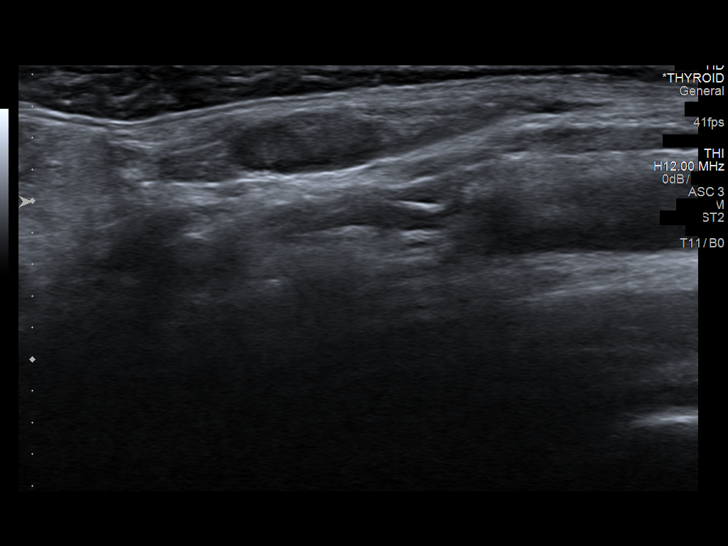
[im 25/28]
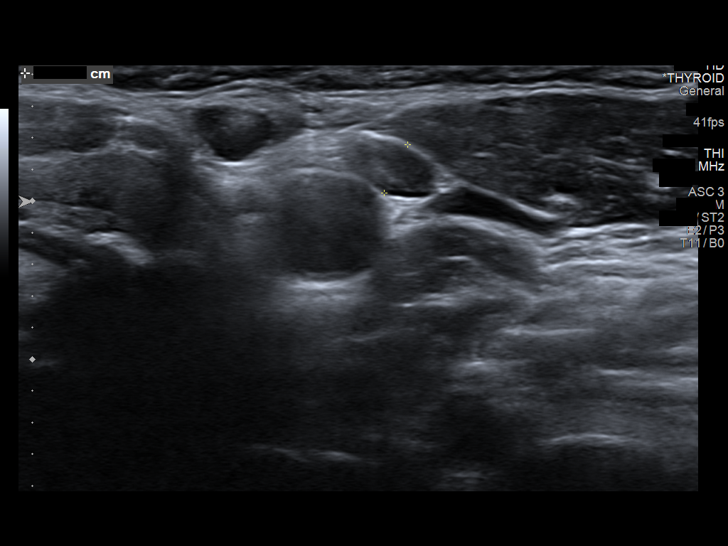
[im 28/28]
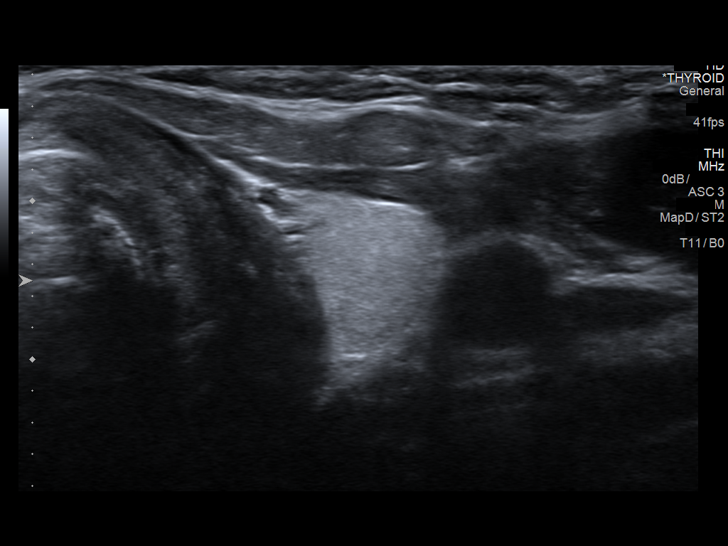

[13 of 25 positions shown; findings below may reference images not displayed]

FINDINGS: Parenchymal Echotexture: Mildly heterogenous

Isthmus: 0.4 cm thickness, previously

Right lobe: 4.9 x 1.5 x 1.6 cm (previously 4.6 x 1.3 x 1.4)

Left lobe: 4.4 x 1 x 1.6 cm (previously 3.8 x 1.1 x 1.4)

_________________________________________________________

Estimated total number of nodules >/= 1 cm: 0

Number of spongiform nodules >/=  2 cm not described below (TR1): 0

Number of mixed cystic and solid nodules >/= 1.5 cm not described
below (TR2): 0

Nodule # 1:

Location: Right; Superior

Maximum size: 0.9 cm cm; Other 2 dimensions: 0.6 x 0.9 cm,
previously 0.9 x 0.6 x 0.8 cm

Composition: solid/almost completely solid (2)

Echogenicity: hypoechoic (2)

Shape: not taller-than-wide (0)

Margins: smooth (0)

Echogenic foci: none (0)

ACR TI-RADS total points: 4.

ACR TI-RADS risk category: TR4 (4-6 points).

ACR TI-RADS recommendations:

Given size (<0.9 cm) and appearance, this nodule does NOT meet
TI-RADS criteria for biopsy or dedicated follow-up.

Additionally, no significant interval enlargement over greater than
5 years indicates benignity.
IMPRESSION: 1. Stable subcentimeter right nodule for greater than 5 years. No
further dedicated imaging follow-up or biopsy indicated.

The above is in keeping with the ACR TI-RADS recommendations - [HOSPITAL] 9266;[DATE].

## 2017-09-01 ENCOUNTER — Encounter: Payer: Self-pay | Admitting: Family Medicine

## 2017-09-01 ENCOUNTER — Ambulatory Visit: Payer: 59 | Admitting: Family Medicine

## 2017-09-01 ENCOUNTER — Ambulatory Visit (INDEPENDENT_AMBULATORY_CARE_PROVIDER_SITE_OTHER): Payer: 59

## 2017-09-01 VITALS — BP 124/85 | HR 107 | Temp 97.9°F | Ht 65.0 in | Wt 155.8 lb

## 2017-09-01 DIAGNOSIS — J329 Chronic sinusitis, unspecified: Secondary | ICD-10-CM

## 2017-09-01 DIAGNOSIS — J40 Bronchitis, not specified as acute or chronic: Secondary | ICD-10-CM

## 2017-09-01 DIAGNOSIS — R059 Cough, unspecified: Secondary | ICD-10-CM

## 2017-09-01 DIAGNOSIS — R05 Cough: Secondary | ICD-10-CM

## 2017-09-01 MED ORDER — AZITHROMYCIN 250 MG PO TABS: ORAL_TABLET | ORAL | 0 refills | Status: DC

## 2017-09-01 MED ORDER — IPRATROPIUM BROMIDE 0.06 % NA SOLN: 2.0000 | Freq: Four times a day (QID) | NASAL | 0 refills | Status: DC

## 2017-09-01 MED ORDER — METHYLPREDNISOLONE ACETATE 80 MG/ML IJ SUSP
80.0000 mg | Freq: Once | INTRAMUSCULAR | Status: AC
  Administered 2017-09-01: 80 mg via INTRAMUSCULAR

## 2017-09-01 NOTE — Patient Instructions (Signed)
Start the zpack and atrovent.  Let us know if symptoms are not improving.  Take care,  Dr Jimmey RalphParker

## 2017-09-01 NOTE — Addendum Note (Signed)
Addended by: Tempie HoistMCNEIL, Torrian Canion M on: 09/01/2017 02:26 PM   Modules accepted: Orders

## 2017-09-01 NOTE — Progress Notes (Signed)
    Subjective:  Erika Gardner is a 41 y.o. female who presents today with a chief complaint of cough.   HPI:  Cough, acute issue Symptoms started 3 months ago.Patient seen 2-1/2 months ago at urgent care for sinus infection.  Was started on Augmentin.  Later seen by her PCP 4 days later for sinus infection and had Flonase added to her regimen.  She is also given a injection of 80 mg Depo-Medrol.  She thinks that the steroid injection helped some with her cough.  Over the last week and especially the last 24 hours her cough has significantly worsened.  She has also had some tightness in her chest.  She has used her albuterol inhaler which is helped a little bit.  No fevers or chills.  No sick contacts.  Associated symptoms include nasal congestion, postnasal drip, headache.  No other alleviating or aggravating factors noted.  ROS: Per HPI  PMH: Smoking history reviewed.  Never smoker.  Objective:  Physical Exam: BP 124/85   Pulse (!) 107   Temp 97.9 F (36.6 C) (Oral)   Ht 5\' 5"  (1.651 m)   Wt 155 lb 12.8 oz (70.7 kg)   LMP 08/20/2017   SpO2 99%   BMI 25.93 kg/m   Gen: NAD, resting comfortably HEENT: TMs clear bilaterally.  Left maxillary sinus with decreased transillumination.  Right maxillary sinus with decreased transillumination.  Oropharynx clear without exudate. CV: RRR with no murmurs appreciated Pulm: NWOB, rhonchorous breath sounds noted in bilateral bases with faint end expiratory wheeze.  Assessment/Plan:  1.  Cough 2.  Bronchitis 3.  Sinusitis Given that her symptoms have persisted for the last several months and symmetrically worsened within the last week, it is reasonable to try an empiric course of antibiotics.  Azithromycin was sent in for patient.  Respiratory exam is stable, though she does have a few wheezes and rhonchorous breath sounds.  Patient was given 80 mg IM Depo-Medrol today.  Chest x-ray did not reveal any lobular infiltrates-we will await radiology  read.  Encouraged patient to use albuterol inhaler every 4-6 hours over the next day or 2.  She also has significant upper respiratory disease including sinusitis.  Advised patient to stop Flonase and start Atrovent nasal spray for this.  Return precautions reviewed.  Follow-up as needed.  Katina Degreealeb M. Jimmey RalphParker, MD 09/01/2017 12:46 PM

## 2017-09-03 ENCOUNTER — Ambulatory Visit: Payer: 59 | Admitting: Family Medicine

## 2017-09-03 ENCOUNTER — Encounter: Payer: Self-pay | Admitting: Family Medicine

## 2017-09-03 ENCOUNTER — Telehealth: Payer: Self-pay | Admitting: Family Medicine

## 2017-09-03 VITALS — BP 101/69 | HR 100 | Temp 98.1°F | Wt 157.0 lb

## 2017-09-03 DIAGNOSIS — J0100 Acute maxillary sinusitis, unspecified: Secondary | ICD-10-CM | POA: Diagnosis not present

## 2017-09-03 MED ORDER — FLUCONAZOLE 150 MG PO TABS: 150.0000 mg | ORAL_TABLET | Freq: Once | ORAL | 0 refills | Status: AC

## 2017-09-03 MED ORDER — PREDNISONE 20 MG PO TABS: 20.0000 mg | ORAL_TABLET | Freq: Every day | ORAL | 0 refills | Status: DC

## 2017-09-03 MED ORDER — AMOXICILLIN-POT CLAVULANATE 875-125 MG PO TABS: 1.0000 | ORAL_TABLET | Freq: Two times a day (BID) | ORAL | 0 refills | Status: DC

## 2017-09-03 NOTE — Telephone Encounter (Signed)
Copied from CRM [id number]. Topic: Quick Communication - See Telephone Encounter >> Sep 03, 2017  8:58 AM Diana EvesHoyt, Maryann B wrote: CRM for notification. See Telephone encounter for:  Seen Monday by Dr. Jimmey RalphParker and was given an antibiotic and pt is prone to yeast infections and feels she is getting one and would like to know if something can be called in for that. Also pt is on day 3 of antibiotic and isn't feeling any better and would like to know when she should follow up if symptoms don't get better.  09/03/17.

## 2017-09-03 NOTE — Telephone Encounter (Signed)
Medication sent in for patient. 

## 2017-09-03 NOTE — Progress Notes (Signed)
Erika Gardner , May 22, 1976, 41 y.o., female MRN: [id number] Patient Care Team    Relationship Specialty Notifications Start End  Natalia LeatherwoodKuneff, Renee A, DO PCP - General Family Medicine  06/01/15   Alfredo MartinezMacDiarmid, Scott, MD Consulting Physician Urology  09/12/16   Cox, Ronnald CollumLauri P, MD Referring Physician Nurse Practitioner  10/21/16    Comment: Gynecology  Ivery QualeWright, Christine R., DPM  Podiatry  10/21/16   Hilda LiasBotero, Ernesto, MD Consulting Physician Neurosurgery  10/21/16   Benjiman CoreShadad, Firas N, MD Consulting Physician Oncology  10/21/16   Glenis SmokerMutton, Thomas P., MD Referring Physician Surgery  10/21/16   [facility]  Gastroenterology  10/21/16   Tanya NonesHarper, Amy V, OD  Optometry  01/02/17     Chief Complaint  Patient presents with  . URI     Subjective: Pt presents for an OV with complaints of continued cough, headache, sinus pressure and increased phlegm production . She was seen 2 days ago at another clinic and was provided with IM steroid shot and azith. Pt has tried Atrovent nasal spray to ease their symptoms. She was seen just prior to Thanksgiving in urgent care and was treated with prednisone and Tessalon Perles. She initially thought she was getting better, and then turn around and became worse. Patient reports he has been seen 2 days ago she feels she is continuously getting worse instead of seeing any improvement.  [facility] 03/17/2017 10/21/2016  Decreased Interest 0 0  Down, Depressed, Hopeless 0 0  PHQ - 2 Score 0 0  Altered sleeping 3 -  Tired, decreased energy 3 -  Change in appetite 1 -  Feeling bad or failure about yourself  0 -  Trouble concentrating 2 -  Moving slowly or fidgety/restless 0 -  Suicidal thoughts 0 -  PHQ-9 Score 9 -    Allergies  Allergen Reactions  . Savella [Milnacipran Hcl] Other (See Comments)    MOODY ANGRY PALPITATIONS  . Hydromorphone Hcl Itching   Social History   Tobacco Use  . Smoking status: Never Smoker  . Smokeless tobacco:  Never Used  Substance Use Topics  . Alcohol use: Yes    Comment: socially- occasional- 4-5 x in a month     Past Medical History:  Diagnosis Date  . Abnormal mammogram of right breast 09/2012   stable hypoechoic lesion  . Anxiety   . Asthma    somewhat sounding like exercise induced relative to temperature   . B12 deficiency   . Carpal tunnel syndrome    both wrists  . Complication of anesthesia    pt. reports that the combination of morphine & / or dilaudid with anesthesia  presents with itching    . Fibromyalgia   . GERD (gastroesophageal reflux disease)   . H/O exercise stress test 03/10/2017   Ef 60-65%, no ischemic changes.   . Hematuria   . Hemochromatosis   . Insomnia   . Neuromuscular disorder (HCC)   . Panic attacks   . Seizures (HCC)    x2 with a syncopal episode (03/2015- last episode)  . Sleep apnea    not needing treatment, through a sleep study done showing that if she is on her back she has slight sleep apnea.    . Snoring   . Thyroid disease    multinodular goiter; followed for > 5 years, unchanged. No further imaging needed 09/2016.  Marland Kitchen. Vertigo   . Vitamin D deficiency    Past Surgical History:  Procedure Laterality Date  .  APPENDECTOMY    . CHOLECYSTECTOMY  2014  . DILATION AND CURETTAGE OF UTERUS  2007   Post miscarriage  . HERNIA REPAIR     umbilicus  . KNEE ARTHROSCOPY Right 2014   wear and tear  . LUMBAR LAMINECTOMY/DECOMPRESSION MICRODISCECTOMY Bilateral 05/10/2016   Procedure: Bilateral Lumbar five-sacral one Microdiskectomy;  Surgeon: Hilda Lias, MD;  Location: [facility];  Service: Neurosurgery;  Laterality: Bilateral;  bilateral  . VAGINAL DELIVERY     x3   Family History  Problem Relation Age of Onset  . Arthritis Maternal Grandmother   . Hearing loss Maternal Grandmother   . Cancer Maternal Grandfather        skin  . Heart disease Maternal Grandfather   . Arthritis Paternal Grandmother   . Cancer Paternal Grandfather         kidney  . Heart disease Paternal Grandfather   . Kidney disease Paternal Grandfather    Allergies as of 09/03/2017      Reactions   Savella [milnacipran Hcl] Other (See Comments)   MOODY ANGRY PALPITATIONS   Hydromorphone Hcl Itching      Medication List        Accurate as of 09/03/17  1:21 PM. Always use your most recent med list.          albuterol 108 (90 Base) MCG/ACT inhaler Commonly known as:  PROVENTIL HFA;VENTOLIN HFA Inhale 2 puffs into the lungs every 6 (six) hours as needed for wheezing or shortness of breath.   azithromycin 250 MG tablet Commonly known as:  ZITHROMAX Take 2 tabs day 1, then 1 tab daily   escitalopram 10 MG tablet Commonly known as:  LEXAPRO Take 1 tablet (10 mg total) by mouth at bedtime.   fluconazole 150 MG tablet Commonly known as:  DIFLUCAN Take 1 tablet (150 mg total) by mouth once for 1 dose.   fluticasone 50 MCG/ACT nasal spray Commonly known as:  FLONASE Place into both nostrils daily.   ipratropium 0.06 % nasal spray Commonly known as:  ATROVENT Place 2 sprays into both nostrils 4 (four) times daily.   norethindrone-ethinyl estradiol 1-20 MG-MCG tablet Commonly known as:  JUNEL FE,GILDESS FE,LOESTRIN FE Take 1 tablet by mouth at bedtime.   omeprazole 20 MG capsule Commonly known as:  PRILOSEC Take 2 capsules (40 mg total) by mouth daily.   ranitidine 150 MG tablet Commonly known as:  ZANTAC Take 1 tablet (150 mg total) by mouth 2 (two) times daily.   traZODone 50 MG tablet Commonly known as:  DESYREL Take 1 tablet (50 mg total) by mouth at bedtime.   vitamin B-12 1000 MCG tablet Commonly known as:  CYANOCOBALAMIN Take 1 tablet (1,000 mcg total) by mouth daily.   Vitamin D3 1000 units Caps Take 3,000 Units by mouth.       All past medical history, surgical history, allergies, family history, immunizations andmedications were updated in the EMR today and reviewed under the history and medication portions of their  EMR.     ROS: Negative, with the exception of above mentioned in HPI   Objective:  BP 101/69 (BP Location: Right Arm, Patient Position: Sitting, Cuff Size: Normal)   Pulse 100   Temp 98.1 F (36.7 C)   Wt 157 lb (71.2 kg)   LMP 08/20/2017   SpO2 99%   BMI 26.13 kg/m  Body mass index is 26.13 kg/m. Gen: Afebrile. No acute distress. Nontoxic in appearance, well developed, well nourished.  HENT: AT. [place]. Bilateral  TM visualized with left TM erythema a. MMM, no oral lesions. Bilateral nares with moderate erythema, swelling, drainage. Throat with mild erythema, no exudates. PND present. Harsh cough and hoarseness present.   Eyes:Pupils Equal Round Reactive to light, Extraocular movements intact,  Conjunctiva without redness, discharge or icterus. Neck/lymp/endocrine: Supple,mild bilateral ant cervical lymphadenopathy CV: RRR  Chest: CTAB, no wheeze or crackles. Good air movement, normal resp effort.  Abd: Soft. NTND. BS present.  Neuro:  Normal gait. PERLA. EOMi. Alert. Oriented x3  No exam data present No results found. No results found for this or any previous visit (from the past 24 hour(s)).  Assessment/Plan: Erika Gardner is a 41 y.o. female present for OV for   Acute maxillary sinusitis, recurrence not specified Rest, hydrate.  + flonase, mucinex (DM if cough), nettie pot or nasal saline.  Augmentin and prednisone prescribed, take until completed.  Stop azith.  If cough present it can last up to 6-8 weeks.  F/U 2 weeks of not improved.     Reviewed expectations re: course of current medical issues.  Discussed self-management of symptoms.  Outlined signs and symptoms indicating need for more acute intervention.  Patient verbalized understanding and all questions were answered.  Patient received an After-Visit Summary.    No orders of the defined types were placed in this encounter.    Note is dictated utilizing voice recognition software. Although note has  been proof read prior to signing, occasional typographical errors still can be missed. If any questions arise, please do not hesitate to call for verification.   electronically signed by:  Felix Pacinienee Kuneff, DO  [facility] - [place]

## 2017-09-03 NOTE — Patient Instructions (Signed)
Rest, hydrate.  + flonase, mucinex (DM if cough), nettie pot or nasal saline.  Augmentin and prednisone prescribed, take until completed.  If cough present it can last up to 6-8 weeks.  F/U 2 weeks of not improved.

## 2017-09-03 NOTE — Telephone Encounter (Signed)
PAtient should finish course of abx. Please send in a single dose of diflucan.  Katina Degreealeb M. Jimmey RalphParker, MD 09/03/2017 12:54 PM

## 2017-09-03 NOTE — Telephone Encounter (Signed)
Please advise if okay to send in medication.

## 2017-09-29 ENCOUNTER — Telehealth: Payer: Self-pay | Admitting: *Deleted

## 2017-09-29 MED ORDER — ESCITALOPRAM OXALATE 10 MG PO TABS: 10.0000 mg | ORAL_TABLET | Freq: Every day | ORAL | 0 refills | Status: DC

## 2017-09-29 NOTE — Telephone Encounter (Signed)
30 day refill on lexapro sent to patient pharmacy. Patient needs office visit prior to anymore refills.

## 2017-10-01 NOTE — Telephone Encounter (Signed)
Insurance states that it must be a 90 day supply in order to pay. Patient is scheduled for appt on 10/02/17. Patient is out of meds. Please advise

## 2017-10-01 NOTE — Telephone Encounter (Signed)
Spoke with patient explained to her 30 day supply sent to get her through until her appt after she is seen we can do 90 day supply. Explained to patient importance of scheduling her follow up appts prior to running out of medication. Patient verbalized understanding .

## 2017-10-02 ENCOUNTER — Encounter: Payer: Self-pay | Admitting: Family Medicine

## 2017-10-02 ENCOUNTER — Ambulatory Visit: Payer: 59 | Admitting: Family Medicine

## 2017-10-02 VITALS — BP 100/73 | HR 100 | Temp 98.3°F | Resp 18 | Ht 65.0 in | Wt 159.5 lb

## 2017-10-02 DIAGNOSIS — G4700 Insomnia, unspecified: Secondary | ICD-10-CM

## 2017-10-02 DIAGNOSIS — F411 Generalized anxiety disorder: Secondary | ICD-10-CM

## 2017-10-02 MED ORDER — TRAZODONE HCL 50 MG PO TABS: 50.0000 mg | ORAL_TABLET | Freq: Every day | ORAL | 1 refills | Status: DC

## 2017-10-02 MED ORDER — ESCITALOPRAM OXALATE 10 MG PO TABS: 10.0000 mg | ORAL_TABLET | Freq: Every day | ORAL | 1 refills | Status: DC

## 2017-10-02 NOTE — Progress Notes (Signed)
Patient ID: Erika Gardner, female   DOB: [birth year], 42 y.o.   MRN: [id number]   Subjective:    Patient ID: Erika Gardner, female    DOB: [birth year], 42 y.o.   MRN: [id number]  Chief Complaint  Patient presents with  . Anxiety  . Insomnia    Anxiety/insomnia: Pt returns today to follow up on her anxiety and insomnia. She reports compliance with lexapro 10 mg qhs and trazodone 25-50 qhs. She is sleeping well on this regimen and feels her anxiety is well controlled.   Prior note 02/2017: Pt presents for anxiety follow up. She reports she is going through more stress than usual, not sleeping as well, more fatigued. She and her husband are having some issues, it is the year mark of her father's death. She had ol d ativan script and took one before bed and it did help her sleep better. Without the medication she is waking through out the night with anxiety. She is wondering if her Vit d or b12 levels have something to do with her fatigue. Both of which were checked in the last 6 months and looked great, she has continued the same supplements daily. She reports compliance with Lexapro 10 mg QD. She did try to take herself off the medication, but realized she did need it and restarted the 10 mg dose.  [facility] 10/02/2017 03/17/2017 10/21/2016  Decreased Interest 0 0 0  Down, Depressed, Hopeless 0 0 0  PHQ - 2 Score 0 0 0  Altered sleeping - 3 -  Tired, decreased energy - 3 -  Change in appetite - 1 -  Feeling bad or failure about yourself  - 0 -  Trouble concentrating - 2 -  Moving slowly or fidgety/restless - 0 -  Suicidal thoughts - 0 -  PHQ-9 Score - 9 -   GAD 7 : Generalized Anxiety Score 10/02/2017 03/17/2017  Nervous, Anxious, on Edge 0 3  Control/stop worrying 0 1  Worry too much - different things 0 1  Trouble relaxing 0 0  Restless 0 0  Easily annoyed or irritable 0 0  Afraid - awful might happen 0 0  Total GAD 7 Score 0 5  Anxiety Difficulty Not difficult at  all -    Past Medical History:  Diagnosis Date  . Abnormal mammogram of right breast 09/2012   stable hypoechoic lesion  . Anxiety   . Asthma    somewhat sounding like exercise induced relative to temperature   . B12 deficiency   . Carpal tunnel syndrome    both wrists  . Complication of anesthesia    pt. reports that the combination of morphine & / or dilaudid with anesthesia  presents with itching    . Fibromyalgia   . GERD (gastroesophageal reflux disease)   . H/O exercise stress test 03/10/2017   Ef 60-65%, no ischemic changes.   . Hematuria   . Hemochromatosis   . Insomnia   . Neuromuscular disorder (HCC)   . Panic attacks   . Seizures (HCC)    x2 with a syncopal episode (03/2015- last episode)  . Sleep apnea    not needing treatment, through a sleep study done showing that if she is on her back she has slight sleep apnea.    . Snoring   . Thyroid disease    multinodular goiter; followed for > 5 years, unchanged. No further imaging needed 09/2016.  Marland Kitchen. Vertigo   . Vitamin D deficiency  Allergies  Allergen Reactions  . Savella [Milnacipran Hcl] Other (See Comments)    MOODY ANGRY PALPITATIONS  . Hydromorphone Hcl Itching    Review of Systems Negative, with the exception of above mentioned in HPI     Objective:   Physical Exam BP 100/73 (BP Location: Right Arm, Patient Position: Sitting, Cuff Size: Normal)   Pulse 100   Temp 98.3 F (36.8 C)   Resp 18   Ht 5\' 5"  (1.651 m)   Wt 159 lb 8 oz (72.3 kg)   LMP 09/22/2017   SpO2 97%   BMI 26.54 kg/m  Gen: Afebrile. No acute distress.  HENT: AT. [place].  MMM.  Eyes:Pupils Equal Round Reactive to light, Extraocular movements intact,  Conjunctiva without redness, discharge or icterus. Psych: Normal affect, dress and demeanor. Normal speech. Normal thought content and judgment.    Assessment & Plan:  Erika Gardner is 42 y.o. female present for follow-up Generalized anxiety disorder/insomnia - well controlled  anxiety with new onset insomnia - continue lexapro 10 mg QD. Refills provided 6 mos -  trazodone 25-50 QHS, refills provided for 6 mos - f/u 6 mos  Electronically Signed by: Felix Pacini, DO [facility]- [place]

## 2017-10-02 NOTE — Patient Instructions (Signed)
I am glad you are doing so well. I have refilled your medications for 6 months.    followup in 6 months.    Please help us help you:  [facility] are [facility]. Below you will find basic instructions that you may need to access in the future. Please help us help you by reading the instructions, which cover many of the frequent questions we experience.   Prescription refills and request:  -In order to allow more efficient response time, please call your pharmacy for all refills. They will forward the request electronically to us. This allows for the quickest possible response. Request left on a nurse line can take longer to refill, since these are checked as time allows between office patients and other phone calls.  - refill request can take up to 3-5 working days to complete.  - If request is sent electronically and request is appropiate, it is usually completed in 1-2 business days.  - all patients will need to be seen routinely for all chronic medical conditions requiring prescription medications (see follow-up below). If you are overdue for follow up on your condition, you will be asked to make an appointment and we will call in enough medication to cover you until your appointment (up to 30 days).  - all controlled substances will require a face to face visit to request/refill.  - if you desire your prescriptions to go through a new pharmacy, and have an active script at original pharmacy, you will need to call your pharmacy and have scripts transferred to new pharmacy. This is completed between the pharmacy locations and not by your provider.    Results: If any images or labs were ordered, it can take up to 1 week to get results depending on the test ordered and the lab/facility running and resulting the test. - Normal or stable results, which do not need further discussion, may be released to your mychart immediately with attached note to  you. A call may not be generated for normal results. Please make certain to sign up for mychart. If you have questions on how to activate your mychart you can call the front office.  - If your results need further discussion, our office will attempt to contact you via phone, and if unable to reach you after 2 attempts, we will release your abnormal result to your mychart with instructions.  - All results will be automatically released in mychart after 1 week.  - Your provider will provide you with explanation and instruction on all relevant material in your results. Please keep in mind, results and labs may appear confusing or abnormal to the untrained eye, but it does not mean they are actually abnormal for you personally. If you have any questions about your results that are not covered, or you desire more detailed explanation than what was provided, you should make an appointment with your provider to do so.   Our office handles many outgoing and incoming calls daily. If we have not contacted you within 1 week about your results, please check your mychart to see if there is a message first and if not, then contact our office.  In helping with this matter, you help decrease call volume, and therefore allow us to be able to respond to patients needs more efficiently.   Acute office visits (sick visit):  An acute visit is intended for a new problem and are scheduled in shorter time  slots to allow schedule openings for patients with new problems. This is the appropriate visit to discuss a new problem. In order to provide you with excellent quality medical care with proper time for you to explain your problem, have an exam and receive treatment with instructions, these appointments should be limited to one new problem per visit. If you experience a new problem, in which you desire to be addressed, please make an acute office visit, we save openings on the schedule to accommodate you. Please do not save your  new problem for any other type of visit, let us take care of it properly and quickly for you.   Follow up visits:  Depending on your condition(s) your provider will need to see you routinely in order to provide you with quality care and prescribe medication(s). Most chronic conditions (Example: hypertension, Diabetes, depression/anxiety... etc), require visits a couple times a year. Your provider will instruct you on proper follow up for your personal medical conditions and history. Please make certain to make follow up appointments for your condition as instructed. Failing to do so could result in lapse in your medication treatment/refills. If you request a refill, and are overdue to be seen on a condition, we will always provide you with a 30 day script (once) to allow you time to schedule.    Medicare wellness (well visit): - we have a wonderful Nurse Maudie Mercury), that will meet with you and provide you will yearly medicare wellness visits. These visits should occur yearly (can not be scheduled less than 1 calendar year apart) and cover preventive health, immunizations, advance directives and screenings you are entitled to yearly through your medicare benefits. Do not miss out on your entitled benefits, this is when medicare will pay for these benefits to be ordered for you.  These are strongly encouraged by your provider and is the appropriate type of visit to make certain you are up to date with all preventive health benefits. If you have not had your medicare wellness exam in the last 12 months, please make certain to schedule one by calling the office and schedule your medicare wellness with Maudie Mercury as soon as possible.   Yearly physical (well visit):  - Adults are recommended to be seen yearly for physicals. Check with your insurance and date of your last physical, most insurances require one calendar year between physicals. Physicals include all preventive health topics, screenings, medical exam and labs  that are appropriate for gender/age and history. You may have fasting labs needed at this visit. This is a well visit (not a sick visit), new problems should not be covered during this visit (see acute visit).  - Pediatric patients are seen more frequently when they are younger. Your provider will advise you on well child visit timing that is appropriate for your their age. - This is not a medicare wellness visit. Medicare wellness exams do not have an exam portion to the visit. Some medicare companies allow for a physical, some do not allow a yearly physical. If your medicare allows a yearly physical you can schedule the medicare wellness with our nurse Maudie Mercury and have your physical with your provider after, on the same day. Please check with insurance for your full benefits.   Late Policy/No Shows:  - all new patients should arrive 15-30 minutes earlier than appointment to allow [place] time  to  obtain all personal demographics,  insurance information and for you to complete office paperwork. - All established patients should arrive  10-15 minutes earlier than appointment time to update all information and be checked in .  - In our best efforts to run on time, if you are late for your appointment you will be asked to either reschedule or if able, we will work you back into the schedule. There will be a wait time to work you back in the schedule,  depending on availability.  - If you are unable to make it to your appointment as scheduled, please call 24 hours ahead of time to allow [place] to fill the time slot with someone else who needs to be seen. If you do not cancel your appointment ahead of time, you may be charged a no show fee.

## 2017-10-15 ENCOUNTER — Ambulatory Visit: Payer: BLUE CROSS/BLUE SHIELD | Admitting: Family Medicine

## 2017-10-15 ENCOUNTER — Encounter: Payer: Self-pay | Admitting: Family Medicine

## 2017-10-15 VITALS — BP 118/77 | HR 76 | Temp 98.2°F | Resp 20 | Wt 154.5 lb

## 2017-10-15 DIAGNOSIS — R319 Hematuria, unspecified: Secondary | ICD-10-CM | POA: Diagnosis not present

## 2017-10-15 DIAGNOSIS — R350 Frequency of micturition: Secondary | ICD-10-CM | POA: Diagnosis not present

## 2017-10-15 DIAGNOSIS — N390 Urinary tract infection, site not specified: Secondary | ICD-10-CM | POA: Diagnosis not present

## 2017-10-15 LAB — POC URINALSYSI DIPSTICK (AUTOMATED)
Bilirubin, UA: NEGATIVE
Glucose, UA: NEGATIVE
Ketones, UA: NEGATIVE
NITRITE UA: NEGATIVE
PROTEIN UA: NEGATIVE
Spec Grav, UA: >=1.03 — AB (ref 1.010–1.025)
UROBILINOGEN UA: 0.2 U/dL
pH, UA: 5.5 (ref 5.0–8.0)

## 2017-10-15 MED ORDER — PHENAZOPYRIDINE HCL 97.2 MG PO TABS: 97.0000 mg | ORAL_TABLET | Freq: Three times a day (TID) | ORAL | 0 refills | Status: DC | PRN

## 2017-10-15 MED ORDER — NITROFURANTOIN MONOHYD MACRO 100 MG PO CAPS: 100.0000 mg | ORAL_CAPSULE | Freq: Two times a day (BID) | ORAL | 0 refills | Status: DC

## 2017-10-15 NOTE — Progress Notes (Signed)
Erika Gardner , [birth year], 42 y.o., female MRN: [id number] Patient Care Team    Relationship Specialty Notifications Start End  Natalia Leatherwood, DO PCP - General Family Medicine  06/01/15   Alfredo Martinez, MD Consulting Physician Urology  09/12/16   Cox, Ronnald Collum, MD Referring Physician Nurse Practitioner  10/21/16    Comment: Gynecology  Ivery Quale., DPM  Podiatry  10/21/16   Hilda Lias, MD Consulting Physician Neurosurgery  10/21/16   Benjiman Core, MD Consulting Physician Oncology  10/21/16   Glenis Smoker., MD Referring Physician Surgery  10/21/16   Specialists, Digestive Health  Gastroenterology  10/21/16   Tanya Nones, OD  Optometry  01/02/17     Chief Complaint  Patient presents with  . Urinary Frequency    burning,pain     Subjective: Pt presents for an OV with complaints of dysuria of 1 day duration.  Associated symptoms include burning with urination, lower abd pain, and urinary frequency. She tried AZO. She denies fever, chills, nausea or vomit.   She has had enterococcus UTI in the past sensitive to macrobid.   Depression screen [facility] 2/9 10/02/2017 03/17/2017 10/21/2016  Decreased Interest 0 0 0  Down, Depressed, Hopeless 0 0 0  PHQ - 2 Score 0 0 0  Altered sleeping - 3 -  Tired, decreased energy - 3 -  Change in appetite - 1 -  Feeling bad or failure about yourself  - 0 -  Trouble concentrating - 2 -  Moving slowly or fidgety/restless - 0 -  Suicidal thoughts - 0 -  PHQ-9 Score - 9 -    Allergies  Allergen Reactions  . Savella [Milnacipran Hcl] Other (See Comments)    MOODY ANGRY PALPITATIONS  . Hydromorphone Hcl Itching   Social History   Tobacco Use  . Smoking status: Never Smoker  . Smokeless tobacco: Never Used  Substance Use Topics  . Alcohol use: Yes    Comment: socially- occasional- 4-5 x in a month     Past Medical History:  Diagnosis Date  . Abnormal mammogram of right breast 09/2012   stable hypoechoic lesion  .  Anxiety   . Asthma    somewhat sounding like exercise induced relative to temperature   . B12 deficiency   . Carpal tunnel syndrome    both wrists  . Complication of anesthesia    pt. reports that the combination of morphine & / or dilaudid with anesthesia  presents with itching    . Fibromyalgia   . GERD (gastroesophageal reflux disease)   . H/O exercise stress test 03/10/2017   Ef 60-65%, no ischemic changes.   . Hematuria   . Hemochromatosis   . Insomnia   . Neuromuscular disorder (HCC)   . Panic attacks   . Seizures (HCC)    x2 with a syncopal episode (03/2015- last episode)  . Sleep apnea    not needing treatment, through a sleep study done showing that if she is on her back she has slight sleep apnea.    . Snoring   . Thyroid disease    multinodular goiter; followed for > 5 years, unchanged. No further imaging needed 09/2016.  Marland Kitchen Vertigo   . Vitamin D deficiency    Past Surgical History:  Procedure Laterality Date  . APPENDECTOMY    . CHOLECYSTECTOMY  2014  . DILATION AND CURETTAGE OF UTERUS  2007   Post miscarriage  . HERNIA REPAIR     umbilicus  .  KNEE ARTHROSCOPY Right 2014   wear and tear  . LUMBAR LAMINECTOMY/DECOMPRESSION MICRODISCECTOMY Bilateral 05/10/2016   Procedure: Bilateral Lumbar five-sacral one Microdiskectomy;  Surgeon: Hilda Lias, MD;  Location: [facility];  Service: Neurosurgery;  Laterality: Bilateral;  bilateral  . VAGINAL DELIVERY     x3   Family History  Problem Relation Age of Onset  . Arthritis Maternal Grandmother   . Hearing loss Maternal Grandmother   . Cancer Maternal Grandfather        skin  . Heart disease Maternal Grandfather   . Arthritis Paternal Grandmother   . Cancer Paternal Grandfather        kidney  . Heart disease Paternal Grandfather   . Kidney disease Paternal Grandfather    Allergies as of 10/15/2017      Reactions   Savella [milnacipran Hcl] Other (See Comments)   MOODY ANGRY PALPITATIONS   Hydromorphone  Hcl Itching      Medication List        Accurate as of 10/15/17  1:14 PM. Always use your most recent med list.          albuterol 108 (90 Base) MCG/ACT inhaler Commonly known as:  PROVENTIL HFA;VENTOLIN HFA Inhale 2 puffs into the lungs every 6 (six) hours as needed for wheezing or shortness of breath.   escitalopram 10 MG tablet Commonly known as:  LEXAPRO Take 1 tablet (10 mg total) by mouth at bedtime. Needs office visit prior to anymore refills   fluticasone 50 MCG/ACT nasal spray Commonly known as:  FLONASE Place into both nostrils daily.   norethindrone-ethinyl estradiol 1-20 MG-MCG tablet Commonly known as:  JUNEL FE,GILDESS FE,LOESTRIN FE Take 1 tablet by mouth at bedtime.   omeprazole 20 MG capsule Commonly known as:  PRILOSEC Take 2 capsules (40 mg total) by mouth daily.   ranitidine 150 MG tablet Commonly known as:  ZANTAC Take 1 tablet (150 mg total) by mouth 2 (two) times daily.   traZODone 50 MG tablet Commonly known as:  DESYREL Take 1 tablet (50 mg total) by mouth at bedtime.   vitamin B-12 1000 MCG tablet Commonly known as:  CYANOCOBALAMIN Take 1 tablet (1,000 mcg total) by mouth daily.   Vitamin D3 1000 units Caps Take 3,000 Units by mouth.       All past medical history, surgical history, allergies, family history, immunizations andmedications were updated in the EMR today and reviewed under the history and medication portions of their EMR.     ROS: Negative, with the exception of above mentioned in HPI   Objective:  BP 118/77 (BP Location: Left Arm, Patient Position: Sitting, Cuff Size: Normal)   Pulse 76   Temp 98.2 F (36.8 C)   Resp 20   Wt 154 lb 8 oz (70.1 kg)   LMP 09/22/2017   SpO2 100%   BMI 25.71 kg/m  Body mass index is 25.71 kg/m. Gen: Afebrile. No acute distress. Nontoxic in appearance, well developed, well nourished.  HENT: AT. [place].  MMM, Eyes:Pupils Equal Round Reactive to light, Extraocular movements intact,   Conjunctiva without redness, discharge or icterus. CV: RRR  Chest: CTAB, no wheeze or crackles.  Abd: Soft. Suprapubic TTP. ND. BS present. no Masses palpated. No rebound or guarding.  MSK: no CVA tenderness  Neuro: Normal gait. PERLA. EOMi. Alert. Oriented x3  No exam data present No results found. Results for orders placed or performed in visit on 10/15/17 (from the past 24 hour(s))  POCT Urinalysis Dipstick (Automated)  Status: Abnormal   Collection Time: 10/15/17  1:10 PM  Result Value Ref Range   Color, UA yellow    Clarity, UA clear    Glucose, UA negative    Bilirubin, UA negative    Ketones, UA negative    Spec Grav, UA >=1.030 (A) 1.010 - 1.025   Blood, UA moderate    pH, UA 5.5 5.0 - 8.0   Protein, UA negative    Urobilinogen, UA 0.2 0.2 or 1.0 E.U./dL   Nitrite, UA negative    Leukocytes, UA Small (1+) (A) Negative    Assessment/Plan: Erika Gardner is a 42 y.o. female present for OV for  Urinary frequency/hematuria/UTI - possible infection by POCT. +leuks, hematuria moderate - treat macrobid (prior UTI sensitive) prescribed. Pyridium prescribed.  - POCT Urinalysis Dipstick (Automated) - Urine Culture Sent - rest and hydrate.  - F/u PRN   Reviewed expectations re: course of current medical issues.  Discussed self-management of symptoms.  Outlined signs and symptoms indicating need for more acute intervention.  Patient verbalized understanding and all questions were answered.  Patient received an After-Visit Summary.   Orders Placed This Encounter  Procedures  . POCT Urinalysis Dipstick (Automated)   Note is dictated utilizing voice recognition software. Although note has been proof read prior to signing, occasional typographical errors still can be missed. If any questions arise, please do not hesitate to call for verification.   electronically signed by:  Felix Pacinienee Kuneff, DO  [facility] - [place]

## 2017-10-17 ENCOUNTER — Telehealth: Payer: Self-pay | Admitting: Family Medicine

## 2017-10-17 LAB — URINE CULTURE
MICRO NUMBER:: 90069069
RESULT: NO GROWTH
SPECIMEN QUALITY:: ADEQUATE

## 2017-10-17 NOTE — Telephone Encounter (Signed)
Spoke with patient reviewed lab results and instructions. Patient verbalized understanding. 

## 2017-10-17 NOTE — Telephone Encounter (Signed)
Copied from CRM [phone]. Topic: Quick Communication - Lab Results >> Oct 17, 2017  9:27 AM Stephannie LiSimmons, Kenley Troop L, NT wrote: Patient called to get lab results please call [phone]

## 2017-11-21 DIAGNOSIS — B373 Candidiasis of vulva and vagina: Secondary | ICD-10-CM | POA: Diagnosis not present

## 2017-11-21 DIAGNOSIS — J0140 Acute pansinusitis, unspecified: Secondary | ICD-10-CM | POA: Diagnosis not present

## 2017-11-27 ENCOUNTER — Ambulatory Visit: Payer: BLUE CROSS/BLUE SHIELD | Admitting: Family Medicine

## 2017-11-27 ENCOUNTER — Encounter: Payer: Self-pay | Admitting: Family Medicine

## 2017-11-27 VITALS — BP 100/67 | HR 92 | Temp 98.2°F | Ht 65.0 in | Wt 152.4 lb

## 2017-11-27 DIAGNOSIS — L700 Acne vulgaris: Secondary | ICD-10-CM | POA: Diagnosis not present

## 2017-11-27 DIAGNOSIS — Z79899 Other long term (current) drug therapy: Secondary | ICD-10-CM | POA: Diagnosis not present

## 2017-11-27 LAB — BASIC METABOLIC PANEL
BUN: 8 mg/dL (ref 6–23)
CO2: 26 meq/L (ref 19–32)
Calcium: 9.5 mg/dL (ref 8.4–10.5)
Chloride: 102 mEq/L (ref 96–112)
Creatinine, Ser: 0.83 mg/dL (ref 0.40–1.20)
GFR: 80.67 mL/min (ref >60.00)
Glucose, Bld: 86 mg/dL (ref 70–99)
POTASSIUM: 3.9 meq/L (ref 3.5–5.1)
SODIUM: 136 meq/L (ref 135–145)

## 2017-11-27 MED ORDER — SPIRONOLACTONE 100 MG PO TABS: 100.0000 mg | ORAL_TABLET | Freq: Every day | ORAL | 1 refills | Status: DC

## 2017-11-27 NOTE — Patient Instructions (Addendum)
Lab appt only in 3-4 weeks to check potassium levels.  Follow up in 3 months

## 2017-11-27 NOTE — Progress Notes (Signed)
Erika Gardner , [birth year], 42 y.o., female MRN: [id number] Patient Care Team    Relationship Specialty Notifications Start End  Natalia Leatherwood, DO PCP - General Family Medicine  06/01/15   Alfredo Martinez, MD Consulting Physician Urology  09/12/16   Cox, Ronnald Collum, MD Referring Physician Nurse Practitioner  10/21/16    Comment: Gynecology  Ivery Quale., DPM  Podiatry  10/21/16   Hilda Lias, MD Consulting Physician Neurosurgery  10/21/16   Benjiman Core, MD Consulting Physician Oncology  10/21/16   Glenis Smoker., MD Referring Physician Surgery  10/21/16   Specialists, Digestive Health  Gastroenterology  10/21/16   Tanya Nones, OD  Optometry  01/02/17     Chief Complaint  Patient presents with  . Acne    pt c/o acne on her chin and back.     Subjective: Pt presents for an OV with complaints of acne of worsening over last few  months duration.  Associated symptoms include cystic lesions face and back.. Pt has tried Differin  to ease their symptoms. She had been on spirolactone for acne and stopped using it because acne had improved a great deal and felt she did not need it any longer. She denies any problems taking spirolactone as cause of stopping use.   Depression screen [facility] 2/9 11/27/2017 10/02/2017 03/17/2017 10/21/2016  Decreased Interest 0 0 0 0  Down, Depressed, Hopeless 0 0 0 0  PHQ - 2 Score 0 0 0 0  Altered sleeping 0 - 3 -  Tired, decreased energy 0 - 3 -  Change in appetite 0 - 1 -  Feeling bad or failure about yourself  0 - 0 -  Trouble concentrating 0 - 2 -  Moving slowly or fidgety/restless 0 - 0 -  Suicidal thoughts 0 - 0 -  PHQ-9 Score 0 - 9 -  Difficult doing work/chores Not difficult at all - - -    Allergies  Allergen Reactions  . Savella [Milnacipran Hcl] Other (See Comments)    MOODY ANGRY PALPITATIONS  . Hydromorphone Hcl Itching   Social History   Tobacco Use  . Smoking status: Never Smoker  . Smokeless tobacco: Never Used    Substance Use Topics  . Alcohol use: Yes    Comment: socially- occasional- 4-5 x in a month     Past Medical History:  Diagnosis Date  . Abnormal mammogram of right breast 09/2012   stable hypoechoic lesion  . Anxiety   . Asthma    somewhat sounding like exercise induced relative to temperature   . B12 deficiency   . Carpal tunnel syndrome    both wrists  . Complication of anesthesia    pt. reports that the combination of morphine & / or dilaudid with anesthesia  presents with itching    . Fibromyalgia   . GERD (gastroesophageal reflux disease)   . H/O exercise stress test 03/10/2017   Ef 60-65%, no ischemic changes.   . Hematuria   . Hemochromatosis   . Insomnia   . Neuromuscular disorder (HCC)   . Panic attacks   . Seizures (HCC)    x2 with a syncopal episode (03/2015- last episode)  . Sleep apnea    not needing treatment, through a sleep study done showing that if she is on her back she has slight sleep apnea.    . Snoring   . Thyroid disease    multinodular goiter; followed for > 5 years, unchanged. No further  imaging needed 09/2016.  Marland Kitchen. Vertigo   . Vitamin D deficiency    Past Surgical History:  Procedure Laterality Date  . APPENDECTOMY    . CHOLECYSTECTOMY  2014  . DILATION AND CURETTAGE OF UTERUS  2007   Post miscarriage  . HERNIA REPAIR     umbilicus  . KNEE ARTHROSCOPY Right 2014   wear and tear  . LUMBAR LAMINECTOMY/DECOMPRESSION MICRODISCECTOMY Bilateral 05/10/2016   Procedure: Bilateral Lumbar five-sacral one Microdiskectomy;  Surgeon: Hilda LiasErnesto Botero, MD;  Location: [facility];  Service: Neurosurgery;  Laterality: Bilateral;  bilateral  . VAGINAL DELIVERY     x3   Family History  Problem Relation Age of Onset  . Arthritis Maternal Grandmother   . Hearing loss Maternal Grandmother   . Cancer Maternal Grandfather        skin  . Heart disease Maternal Grandfather   . Arthritis Paternal Grandmother   . Cancer Paternal Grandfather        kidney  .  Heart disease Paternal Grandfather   . Kidney disease Paternal Grandfather    Allergies as of 11/27/2017      Reactions   Savella [milnacipran Hcl] Other (See Comments)   MOODY ANGRY PALPITATIONS   Hydromorphone Hcl Itching      Medication List        Accurate as of 11/27/17 10:21 AM. Always use your most recent med list.          albuterol 108 (90 Base) MCG/ACT inhaler Commonly known as:  PROVENTIL HFA;VENTOLIN HFA Inhale 2 puffs into the lungs every 6 (six) hours as needed for wheezing or shortness of breath.   escitalopram 10 MG tablet Commonly known as:  LEXAPRO Take 1 tablet (10 mg total) by mouth at bedtime. Needs office visit prior to anymore refills   fluticasone 50 MCG/ACT nasal spray Commonly known as:  FLONASE Place into both nostrils daily.   norethindrone-ethinyl estradiol 1-20 MG-MCG tablet Commonly known as:  JUNEL FE,GILDESS FE,LOESTRIN FE Take 1 tablet by mouth at bedtime.   omeprazole 20 MG capsule Commonly known as:  PRILOSEC Take 2 capsules (40 mg total) by mouth daily.   ranitidine 150 MG tablet Commonly known as:  ZANTAC Take 1 tablet (150 mg total) by mouth 2 (two) times daily.   traZODone 50 MG tablet Commonly known as:  DESYREL Take 1 tablet (50 mg total) by mouth at bedtime.   vitamin B-12 1000 MCG tablet Commonly known as:  CYANOCOBALAMIN Take 1 tablet (1,000 mcg total) by mouth daily.   Vitamin D3 1000 units Caps Take 3,000 Units by mouth.       All past medical history, surgical history, allergies, family history, immunizations andmedications were updated in the EMR today and reviewed under the history and medication portions of their EMR.     ROS: Negative, with the exception of above mentioned in HPI   Objective:  BP 100/67 (BP Location: Right Arm, Patient Position: Sitting, Cuff Size: Normal)   Pulse 92   Temp 98.2 F (36.8 C) (Oral)   Ht 5\' 5"  (1.651 m)   Wt 152 lb 6.4 oz (69.1 kg)   LMP 09/26/2017   SpO2 98%   BMI  25.36 kg/m  Body mass index is 25.36 kg/m. Gen: Afebrile. No acute distress. Nontoxic in appearance, well developed, well nourished.  Skin: no rashes, purpura or petechiae. X5 cystic lesions surrounding chin/mouth area. x6 closed comedones. Healed cystic lesions upper back.    No exam data present No results  found. No results found for this or any previous visit (from the past 24 hour(s)).  Assessment/Plan: Erika Gardner is a 42 y.o. female present for OV for  Encounter for long-term (current) use of medications - Basic Metabolic Panel (BMET) Acne vulgaris Restart spirolactone. Check potassium today and lab appt for recheck in 3 weeks.  F/U with other chronic medical conditions.   Reviewed expectations re: course of current medical issues.  Discussed self-management of symptoms.  Outlined signs and symptoms indicating need for more acute intervention.  Patient verbalized understanding and all questions were answered.  Patient received an After-Visit Summary.    No orders of the defined types were placed in this encounter.    Note is dictated utilizing voice recognition software. Although note has been proof read prior to signing, occasional typographical errors still can be missed. If any questions arise, please do not hesitate to call for verification.   electronically signed by:  Felix Pacini, DO  [facility] - [place]

## 2017-11-28 DIAGNOSIS — H60332 Swimmer's ear, left ear: Secondary | ICD-10-CM | POA: Diagnosis not present

## 2017-11-28 DIAGNOSIS — J00 Acute nasopharyngitis [common cold]: Secondary | ICD-10-CM | POA: Diagnosis not present

## 2017-11-29 ENCOUNTER — Encounter: Payer: Self-pay | Admitting: Family Medicine

## 2017-11-29 DIAGNOSIS — J029 Acute pharyngitis, unspecified: Secondary | ICD-10-CM | POA: Diagnosis not present

## 2017-12-09 DIAGNOSIS — Z6826 Body mass index (BMI) 26.0-26.9, adult: Secondary | ICD-10-CM | POA: Diagnosis not present

## 2017-12-09 DIAGNOSIS — Z01419 Encounter for gynecological examination (general) (routine) without abnormal findings: Secondary | ICD-10-CM | POA: Diagnosis not present

## 2017-12-24 DIAGNOSIS — M9902 Segmental and somatic dysfunction of thoracic region: Secondary | ICD-10-CM | POA: Diagnosis not present

## 2017-12-24 DIAGNOSIS — M9901 Segmental and somatic dysfunction of cervical region: Secondary | ICD-10-CM | POA: Diagnosis not present

## 2017-12-24 DIAGNOSIS — M9905 Segmental and somatic dysfunction of pelvic region: Secondary | ICD-10-CM | POA: Diagnosis not present

## 2017-12-24 DIAGNOSIS — M9903 Segmental and somatic dysfunction of lumbar region: Secondary | ICD-10-CM | POA: Diagnosis not present

## 2018-04-27 ENCOUNTER — Ambulatory Visit: Payer: BLUE CROSS/BLUE SHIELD | Admitting: Family Medicine

## 2018-04-27 ENCOUNTER — Encounter: Payer: Self-pay | Admitting: Family Medicine

## 2018-04-27 VITALS — BP 109/76 | HR 75 | Temp 98.2°F | Resp 20 | Ht 65.0 in | Wt 154.0 lb

## 2018-04-27 DIAGNOSIS — F411 Generalized anxiety disorder: Secondary | ICD-10-CM | POA: Diagnosis not present

## 2018-04-27 DIAGNOSIS — K219 Gastro-esophageal reflux disease without esophagitis: Secondary | ICD-10-CM

## 2018-04-27 DIAGNOSIS — G4700 Insomnia, unspecified: Secondary | ICD-10-CM

## 2018-04-27 MED ORDER — TRAZODONE HCL 50 MG PO TABS: 50.0000 mg | ORAL_TABLET | Freq: Every day | ORAL | 1 refills | Status: DC

## 2018-04-27 MED ORDER — OMEPRAZOLE 20 MG PO CPDR: 20.0000 mg | DELAYED_RELEASE_CAPSULE | Freq: Every day | ORAL | 1 refills | Status: DC

## 2018-04-27 MED ORDER — RANITIDINE HCL 150 MG PO TABS: 150.0000 mg | ORAL_TABLET | Freq: Two times a day (BID) | ORAL | 5 refills | Status: DC

## 2018-04-27 MED ORDER — ESCITALOPRAM OXALATE 10 MG PO TABS: 10.0000 mg | ORAL_TABLET | Freq: Every day | ORAL | 1 refills | Status: DC

## 2018-04-27 NOTE — Progress Notes (Signed)
Patient ID: Erika Gardner, female   DOB: May 28, 1976, 42 y.o.   MRN: [id number]   Subjective:    Patient ID: Erika Gardner, female    DOB: [birth year], 42 y.o.   MRN: [id number]  Chief Complaint  Patient presents with  . Anxiety    6 month follow up   GERD: Using Prilosec as needed and zantac daily. She needs refills today  Anxiety/insomnia: Pt returns today to follow up on her anxiety and insomnia. She reports compliance with lexapro 10 mg qhs and trazodone 25-50 qhs. She is sleeping well on this regimen and feels her anxiety is well controlled.   Prior note 02/2017: Pt presents for anxiety follow up. She reports she is going through more stress than usual, not sleeping as well, more fatigued. She and her husband are having some issues, it is the year mark of her father's death. She had ol d ativan script and took one before bed and it did help her sleep better. Without the medication she is waking through out the night with anxiety. She is wondering if her Vit d or b12 levels have something to do with her fatigue. Both of which were checked in the last 6 months and looked great, she has continued the same supplements daily. She reports compliance with Lexapro 10 mg QD. She did try to take herself off the medication, but realized she did need it and restarted the 10 mg dose.  Depression screen [facility] 2/9 04/27/2018 11/27/2017 10/02/2017 03/17/2017 10/21/2016  Decreased Interest 0 0 0 0 0  Down, Depressed, Hopeless 0 0 0 0 0  PHQ - 2 Score 0 0 0 0 0  Altered sleeping 0 0 - 3 -  Tired, decreased energy 2 0 - 3 -  Change in appetite 0 0 - 1 -  Feeling bad or failure about yourself  0 0 - 0 -  Trouble concentrating 0 0 - 2 -  Moving slowly or fidgety/restless 0 0 - 0 -  Suicidal thoughts 0 0 - 0 -  PHQ-9 Score 2 0 - 9 -  Difficult doing work/chores Not difficult at all Not difficult at all - - -   GAD 7 : Generalized Anxiety Score 04/27/2018 11/27/2017 10/02/2017 03/17/2017  Nervous, Anxious, on  Edge 0 0 0 3  Control/stop worrying 0 0 0 1  Worry too much - different things 0 0 0 1  Trouble relaxing 0 0 0 0  Restless 0 0 0 0  Easily annoyed or irritable 0 0 0 0  Afraid - awful might happen 0 0 0 0  Total GAD 7 Score 0 0 0 5  Anxiety Difficulty - Not difficult at all Not difficult at all -    Past Medical History:  Diagnosis Date  . Abnormal mammogram of right breast 09/2012   stable hypoechoic lesion  . Anxiety   . Asthma    somewhat sounding like exercise induced relative to temperature   . B12 deficiency   . Carpal tunnel syndrome    both wrists  . Complication of anesthesia    pt. reports that the combination of morphine & / or dilaudid with anesthesia  presents with itching    . Fibromyalgia   . GERD (gastroesophageal reflux disease)   . H/O exercise stress test 03/10/2017   Ef 60-65%, no ischemic changes.   . Hematuria   . Hemochromatosis   . Insomnia   . Neuromuscular disorder (HCC)   . Panic attacks   .  Seizures (HCC)    x2 with a syncopal episode (03/2015- last episode)  . Sleep apnea    not needing treatment, through a sleep study done showing that if she is on her back she has slight sleep apnea.    . Snoring   . Thyroid disease    multinodular goiter; followed for > 5 years, unchanged. No further imaging needed 09/2016.  [place] Kitchen. Vertigo   . Vitamin D deficiency    Allergies  Allergen Reactions  . Savella [Milnacipran Hcl] Other (See Comments)    MOODY ANGRY PALPITATIONS  . Hydromorphone Hcl Itching    Review of Systems Negative, with the exception of above mentioned in HPI     Objective:   Physical Exam BP 109/76 (BP Location: Left Arm, Patient Position: Sitting, Cuff Size: Normal)   Pulse 75   Temp 98.2 F (36.8 C)   Resp 20   Ht 5\' 5"  (1.651 m)   Wt 154 lb (69.9 kg)   LMP 04/06/2018   SpO2 97%   BMI 25.63 kg/m  Gen: Afebrile. No acute distress.  HENT: AT. [place].  MMM.  Eyes:Pupils Equal Round Reactive to light, Extraocular movements  intact,  Conjunctiva without redness, discharge or icterus. CV: RRR no murmur Skin: no rashes, purpura or petechiae.  Neuro:  Normal gait. PERLA. EOMi. Alert. Oriented x3 Psych: Normal affect, dress and demeanor. Normal speech. Normal thought content and judgment.     Assessment & Plan:  Erika Gardner is 42 y.o. female present for follow-up Generalized anxiety disorder/insomnia - stable. well controlled anxiety and insomnia - continue lexapro 10 mg QD and trazadone 25-50 QHS; refills provided today - f/u 6 mos   Gastroesophageal reflux disease without esophagitis Stable.  Refills on Prilosec 20 mg qd and zantac BID today.     Electronically Signed by: Felix Pacinienee Toni Hoffmeister, DO [facility] [place]

## 2018-04-27 NOTE — Patient Instructions (Signed)
Great to see you today.  Schedule your physical at your earliest convenience and we will do all your blood work at that time.  Glad you are doing so well.    Please help us help you:  We are honored [facility]. Below you will find basic instructions that you may need to access in the future. Please help us help you by reading the instructions, which cover many of the frequent questions we experience.   Prescription refills and request:  -In order to allow more efficient response time, please call your pharmacy for all refills. They will forward the request electronically to us. This allows for the quickest possible response. Request left on a nurse line can take longer to refill, since these are checked as time allows between office patients and other phone calls.  - refill request can take up to 3-5 working days to complete.  - If request is sent electronically and request is appropiate, it is usually completed in 1-2 business days.  - all patients will need to be seen routinely for all chronic medical conditions requiring prescription medications (see follow-up below). If you are overdue for follow up on your condition, you will be asked to make an appointment and we will call in enough medication to cover you until your appointment (up to 30 days).  - all controlled substances will require a face to face visit to request/refill.  - if you desire your prescriptions to go through a new pharmacy, and have an active script at original pharmacy, you will need to call your pharmacy and have scripts transferred to new pharmacy. This is completed between the pharmacy locations and not by your provider.    Results: If any images or labs were ordered, it can take up to 1 week to get results depending on the test ordered and the lab/facility running and resulting the test. - Normal or stable results, which do not need further discussion, may be released to  your mychart immediately with attached note to you. A call may not be generated for normal results. Please make certain to sign up for mychart. If you have questions on how to activate your mychart you can call the front office.  - If your results need further discussion, our office will attempt to contact you via phone, and if unable to reach you after 2 attempts, we will release your abnormal result to your mychart with instructions.  - All results will be automatically released in mychart after 1 week.  - Your provider will provide you with explanation and instruction on all relevant material in your results. Please keep in mind, results and labs may appear confusing or abnormal to the untrained eye, but it does not mean they are actually abnormal for you personally. If you have any questions about your results that are not covered, or you desire more detailed explanation than what was provided, you should make an appointment with your provider to do so.   Our office handles many outgoing and incoming calls daily. If we have not contacted you within 1 week about your results, please check your mychart to see if there is a message first and if not, then contact our office.  In helping with this matter, you help decrease call volume, and therefore allow us to be able to respond to patients needs more efficiently.   Acute office visits (sick visit):  An acute visit is intended for a  new problem and are scheduled in shorter time slots to allow schedule openings for patients with new problems. This is the appropriate visit to discuss a new problem. Problems will not be addressed by phone call or Echart message. Appointment is needed if requesting treatment. In order to provide you with excellent quality medical care with proper time for you to explain your problem, have an exam and receive treatment with instructions, these appointments should be limited to one new problem per visit. If you experience a new  problem, in which you desire to be addressed, please make an acute office visit, we save openings on the schedule to accommodate you. Please do not save your new problem for any other type of visit, let us take care of it properly and quickly for you.   Follow up visits:  Depending on your condition(s) your provider will need to see you routinely in order to provide you with quality care and prescribe medication(s). Most chronic conditions (Example: hypertension, Diabetes, depression/anxiety... etc), require visits a couple times a year. Your provider will instruct you on proper follow up for your personal medical conditions and history. Please make certain to make follow up appointments for your condition as instructed. Failing to do so could result in lapse in your medication treatment/refills. If you request a refill, and are overdue to be seen on a condition, we will always provide you with a 30 day script (once) to allow you time to schedule.    Medicare wellness (well visit): - we have a wonderful Nurse Maudie Mercury), that will meet with you and provide you will yearly medicare wellness visits. These visits should occur yearly (can not be scheduled less than 1 calendar year apart) and cover preventive health, immunizations, advance directives and screenings you are entitled to yearly through your medicare benefits. Do not miss out on your entitled benefits, this is when medicare will pay for these benefits to be ordered for you.  These are strongly encouraged by your provider and is the appropriate type of visit to make certain you are up to date with all preventive health benefits. If you have not had your medicare wellness exam in the last 12 months, please make certain to schedule one by calling the office and schedule your medicare wellness with Maudie Mercury as soon as possible.   Yearly physical (well visit):  - Adults are recommended to be seen yearly for physicals. Check with your insurance and date of your  last physical, most insurances require one calendar year between physicals. Physicals include all preventive health topics, screenings, medical exam and labs that are appropriate for gender/age and history. You may have fasting labs needed at this visit. This is a well visit (not a sick visit), new problems should not be covered during this visit (see acute visit).  - Pediatric patients are seen more frequently when they are younger. Your provider will advise you on well child visit timing that is appropriate for your their age. - This is not a medicare wellness visit. Medicare wellness exams do not have an exam portion to the visit. Some medicare companies allow for a physical, some do not allow a yearly physical. If your medicare allows a yearly physical you can schedule the medicare wellness with our nurse Maudie Mercury and have your physical with your provider after, on the same day. Please check with insurance for your full benefits.   Late Policy/No Shows:  - all new patients should arrive 15-30 minutes earlier than appointment to allow  [place] time  to  obtain all personal demographics,  insurance information and for you to complete office paperwork. - All established patients should arrive 10-15 minutes earlier than appointment time to update all information and be checked in .  - In our best efforts to run on time, if you are late for your appointment you will be asked to either reschedule or if able, we will work you back into the schedule. There will be a wait time to work you back in the schedule,  depending on availability.  - If you are unable to make it to your appointment as scheduled, please call 24 hours ahead of time to allow [place] to fill the time slot with someone else who needs to be seen. If you do not cancel your appointment ahead of time, you may be charged a no show fee.

## 2018-05-13 ENCOUNTER — Encounter: Payer: BLUE CROSS/BLUE SHIELD | Admitting: Family Medicine

## 2018-05-13 DIAGNOSIS — K219 Gastro-esophageal reflux disease without esophagitis: Secondary | ICD-10-CM | POA: Diagnosis not present

## 2018-05-13 DIAGNOSIS — R1010 Upper abdominal pain, unspecified: Secondary | ICD-10-CM | POA: Diagnosis not present

## 2018-05-13 DIAGNOSIS — Z9049 Acquired absence of other specified parts of digestive tract: Secondary | ICD-10-CM | POA: Diagnosis not present

## 2018-05-18 ENCOUNTER — Encounter: Payer: Self-pay | Admitting: Family Medicine

## 2018-05-18 ENCOUNTER — Other Ambulatory Visit: Payer: Self-pay

## 2018-05-18 ENCOUNTER — Ambulatory Visit (INDEPENDENT_AMBULATORY_CARE_PROVIDER_SITE_OTHER): Payer: BLUE CROSS/BLUE SHIELD | Admitting: Family Medicine

## 2018-05-18 VITALS — BP 107/75 | HR 80 | Temp 98.8°F | Resp 16 | Ht 65.0 in | Wt 149.0 lb

## 2018-05-18 DIAGNOSIS — E538 Deficiency of other specified B group vitamins: Secondary | ICD-10-CM | POA: Diagnosis not present

## 2018-05-18 DIAGNOSIS — Z131 Encounter for screening for diabetes mellitus: Secondary | ICD-10-CM

## 2018-05-18 DIAGNOSIS — Z0001 Encounter for general adult medical examination with abnormal findings: Secondary | ICD-10-CM

## 2018-05-18 DIAGNOSIS — E559 Vitamin D deficiency, unspecified: Secondary | ICD-10-CM | POA: Diagnosis not present

## 2018-05-18 DIAGNOSIS — R5383 Other fatigue: Secondary | ICD-10-CM

## 2018-05-18 DIAGNOSIS — R591 Generalized enlarged lymph nodes: Secondary | ICD-10-CM

## 2018-05-18 DIAGNOSIS — Z13220 Encounter for screening for lipoid disorders: Secondary | ICD-10-CM

## 2018-05-18 LAB — CBC WITH DIFFERENTIAL/PLATELET
Basophils Absolute: 0.1 10*3/uL (ref 0.0–0.1)
Basophils Relative: 1.2 % (ref 0.0–3.0)
EOS PCT: 1.8 % (ref 0.0–5.0)
Eosinophils Absolute: 0.1 10*3/uL (ref 0.0–0.7)
HEMATOCRIT: 40.5 % (ref 36.0–46.0)
Hemoglobin: 13.7 g/dL (ref 12.0–15.0)
LYMPHS ABS: 1.6 10*3/uL (ref 0.7–4.0)
Lymphocytes Relative: 25.6 % (ref 12.0–46.0)
MCHC: 33.8 g/dL (ref 30.0–36.0)
MCV: 89.3 fl (ref 78.0–100.0)
Monocytes Absolute: 0.4 10*3/uL (ref 0.1–1.0)
Monocytes Relative: 6.3 % (ref 3.0–12.0)
NEUTROS PCT: 65.1 % (ref 43.0–77.0)
Neutro Abs: 4 10*3/uL (ref 1.4–7.7)
Platelets: 256 10*3/uL (ref 150.0–400.0)
RBC: 4.54 Mil/uL (ref 3.87–5.11)
RDW: 12.5 % (ref 11.5–15.5)
WBC: 6.2 10*3/uL (ref 4.0–10.5)

## 2018-05-18 LAB — VITAMIN B12: Vitamin B-12: 706 pg/mL (ref 211–911)

## 2018-05-18 LAB — COMPREHENSIVE METABOLIC PANEL
ALK PHOS: 49 U/L (ref 39–117)
ALT: 17 U/L (ref 0–35)
AST: 16 U/L (ref 0–37)
Albumin: 4 g/dL (ref 3.5–5.2)
BUN: 12 mg/dL (ref 6–23)
CO2: 26 mEq/L (ref 19–32)
Calcium: 9 mg/dL (ref 8.4–10.5)
Chloride: 106 mEq/L (ref 96–112)
Creatinine, Ser: 0.96 mg/dL (ref 0.40–1.20)
GFR: 68.04 mL/min (ref >60.00)
Glucose, Bld: 102 mg/dL — ABNORMAL HIGH (ref 70–99)
POTASSIUM: 4 meq/L (ref 3.5–5.1)
Sodium: 139 mEq/L (ref 135–145)
TOTAL PROTEIN: 6.7 g/dL (ref 6.0–8.3)
Total Bilirubin: 0.3 mg/dL (ref 0.2–1.2)

## 2018-05-18 LAB — TSH: TSH: 0.94 u[IU]/mL (ref 0.35–4.50)

## 2018-05-18 LAB — LIPID PANEL
CHOLESTEROL: 162 mg/dL (ref 0–200)
HDL: 63.2 mg/dL (ref >39.00)
LDL Cholesterol: 79 mg/dL (ref 0–99)
NonHDL: 98.74
Total CHOL/HDL Ratio: 3
Triglycerides: 101 mg/dL (ref 0.0–149.0)
VLDL: 20.2 mg/dL (ref 0.0–40.0)

## 2018-05-18 LAB — HEMOGLOBIN A1C: HEMOGLOBIN A1C: 5.5 % (ref 4.6–6.5)

## 2018-05-18 LAB — VITAMIN D 25 HYDROXY (VIT D DEFICIENCY, FRACTURES): VITD: 46.53 ng/mL (ref 30.00–100.00)

## 2018-05-18 NOTE — Patient Instructions (Signed)
I will call you with lab result once available.  Health Maintenance, Female Adopting a healthy lifestyle and getting preventive care can go a long way to promote health and wellness. Talk with your health care provider about what schedule of regular examinations is right for you. This is a good chance for you to check in with your provider about disease prevention and staying healthy. In between checkups, there are plenty of things you can do on your own. Experts have done a lot of research about which lifestyle changes and preventive measures are most likely to keep you healthy. Ask your health care provider for more information. Weight and diet Eat a healthy diet  Be sure to include plenty of vegetables, fruits, low-fat dairy products, and lean protein.  Do not eat a lot of foods high in solid fats, added sugars, or salt.  Get regular exercise. This is one of the most important things you can do for your health. ? Most adults should exercise for at least 150 minutes each week. The exercise should increase your heart rate and make you sweat (moderate-intensity exercise). ? Most adults should also do strengthening exercises at least twice a week. This is in addition to the moderate-intensity exercise.  Maintain a healthy weight  Body mass index (BMI) is a measurement that can be used to identify possible weight problems. It estimates body fat based on height and weight. Your health care provider can help determine your BMI and help you achieve or maintain a healthy weight.  For females 65 years of age and older: ? A BMI below 18.5 is considered underweight. ? A BMI of 18.5 to 24.9 is normal. ? A BMI of 25 to 29.9 is considered overweight. ? A BMI of 30 and above is considered obese.  Watch levels of cholesterol and blood lipids  You should start having your blood tested for lipids and cholesterol at 41 years of age, then have this test every 5 years.  You may need to have your  cholesterol levels checked more often if: ? Your lipid or cholesterol levels are high. ? You are older than 41 years of age. ? You are at high risk for heart disease.  Cancer screening Lung Cancer  Lung cancer screening is recommended for adults 52-8 years old who are at high risk for lung cancer because of a history of smoking.  A yearly low-dose CT scan of the lungs is recommended for people who: ? Currently smoke. ? Have quit within the past 15 years. ? Have at least a 30-pack-year history of smoking. A pack year is smoking an average of one pack of cigarettes a day for 1 year.  Yearly screening should continue until it has been 15 years since you quit.  Yearly screening should stop if you develop a health problem that would prevent you from having lung cancer treatment.  Breast Cancer  Practice breast self-awareness. This means understanding how your breasts normally appear and feel.  It also means doing regular breast self-exams. Let your health care provider know about any changes, no matter how small.  If you are in your 20s or 30s, you should have a clinical breast exam (CBE) by a health care provider every 1-3 years as part of a regular health exam.  If you are 34 or older, have a CBE every year. Also consider having a breast X-ray (mammogram) every year.  If you have a family history of breast cancer, talk to your health care provider  about genetic screening.  If you are at high risk for breast cancer, talk to your health care provider about having an MRI and a mammogram every year.  Breast cancer gene (BRCA) assessment is recommended for women who have family members with BRCA-related cancers. BRCA-related cancers include: ? Breast. ? Ovarian. ? Tubal. ? Peritoneal cancers.  Results of the assessment will determine the need for genetic counseling and BRCA1 and BRCA2 testing.  Cervical Cancer Your health care provider may recommend that you be screened regularly  for cancer of the pelvic organs (ovaries, uterus, and vagina). This screening involves a pelvic examination, including checking for microscopic changes to the surface of your cervix (Pap test). You may be encouraged to have this screening done every 3 years, beginning at age 19.  For women ages 50-65, health care providers may recommend pelvic exams and Pap testing every 3 years, or they may recommend the Pap and pelvic exam, combined with testing for human papilloma virus (HPV), every 5 years. Some types of HPV increase your risk of cervical cancer. Testing for HPV may also be done on women of any age with unclear Pap test results.  Other health care providers may not recommend any screening for nonpregnant women who are considered low risk for pelvic cancer and who do not have symptoms. Ask your health care provider if a screening pelvic exam is right for you.  If you have had past treatment for cervical cancer or a condition that could lead to cancer, you need Pap tests and screening for cancer for at least 20 years after your treatment. If Pap tests have been discontinued, your risk factors (such as having a new sexual partner) need to be reassessed to determine if screening should resume. Some women have medical problems that increase the chance of getting cervical cancer. In these cases, your health care provider may recommend more frequent screening and Pap tests.  Colorectal Cancer  This type of cancer can be detected and often prevented.  Routine colorectal cancer screening usually begins at 41 years of age and continues through 41 years of age.  Your health care provider may recommend screening at an earlier age if you have risk factors for colon cancer.  Your health care provider may also recommend using home test kits to check for hidden blood in the stool.  A small camera at the end of a tube can be used to examine your colon directly (sigmoidoscopy or colonoscopy). This is done to  check for the earliest forms of colorectal cancer.  Routine screening usually begins at age 28.  Direct examination of the colon should be repeated every 5-10 years through 41 years of age. However, you may need to be screened more often if early forms of precancerous polyps or small growths are found.  Skin Cancer  Check your skin from head to toe regularly.  Tell your health care provider about any new moles or changes in moles, especially if there is a change in a mole's shape or color.  Also tell your health care provider if you have a mole that is larger than the size of a pencil eraser.  Always use sunscreen. Apply sunscreen liberally and repeatedly throughout the day.  Protect yourself by wearing long sleeves, pants, a wide-brimmed hat, and sunglasses whenever you are outside.  Heart disease, diabetes, and high blood pressure  High blood pressure causes heart disease and increases the risk of stroke. High blood pressure is more likely to develop in: ?  People who have blood pressure in the high end of the normal range (130-139/85-89 mm Hg). ? People who are overweight or obese. ? People who are African American.  If you are 18-39 years of age, have your blood pressure checked every 3-5 years. If you are 40 years of age or older, have your blood pressure checked every year. You should have your blood pressure measured twice-once when you are at a hospital or clinic, and once when you are not at a hospital or clinic. Record the average of the two measurements. To check your blood pressure when you are not at a hospital or clinic, you can use: ? An automated blood pressure machine at a pharmacy. ? A home blood pressure monitor.  If you are between 55 years and 79 years old, ask your health care provider if you should take aspirin to prevent strokes.  Have regular diabetes screenings. This involves taking a blood sample to check your fasting blood sugar level. ? If you are at a  normal weight and have a low risk for diabetes, have this test once every three years after 41 years of age. ? If you are overweight and have a high risk for diabetes, consider being tested at a younger age or more often. Preventing infection Hepatitis B  If you have a higher risk for hepatitis B, you should be screened for this virus. You are considered at high risk for hepatitis B if: ? You were born in a country where hepatitis B is common. Ask your health care provider which countries are considered high risk. ? Your parents were born in a high-risk country, and you have not been immunized against hepatitis B (hepatitis B vaccine). ? You have HIV or AIDS. ? You use needles to inject street drugs. ? You live with someone who has hepatitis B. ? You have had sex with someone who has hepatitis B. ? You get hemodialysis treatment. ? You take certain medicines for conditions, including cancer, organ transplantation, and autoimmune conditions.  Hepatitis C  Blood testing is recommended for: ? Everyone born from 1945 through 1965. ? Anyone with known risk factors for hepatitis C.  Sexually transmitted infections (STIs)  You should be screened for sexually transmitted infections (STIs) including gonorrhea and chlamydia if: ? You are sexually active and are younger than 41 years of age. ? You are older than 41 years of age and your health care provider tells you that you are at risk for this type of infection. ? Your sexual activity has changed since you were last screened and you are at an increased risk for chlamydia or gonorrhea. Ask your health care provider if you are at risk.  If you do not have HIV, but are at risk, it may be recommended that you take a prescription medicine daily to prevent HIV infection. This is called pre-exposure prophylaxis (PrEP). You are considered at risk if: ? You are sexually active and do not regularly use condoms or know the HIV status of your  partner(s). ? You take drugs by injection. ? You are sexually active with a partner who has HIV.  Talk with your health care provider about whether you are at high risk of being infected with HIV. If you choose to begin PrEP, you should first be tested for HIV. You should then be tested every 3 months for as long as you are taking PrEP. Pregnancy  If you are premenopausal and you may become pregnant, ask your health   care provider about preconception counseling.  If you may become pregnant, take 400 to 800 micrograms (mcg) of folic acid every day.  If you want to prevent pregnancy, talk to your health care provider about birth control (contraception). Osteoporosis and menopause  Osteoporosis is a disease in which the bones lose minerals and strength with aging. This can result in serious bone fractures. Your risk for osteoporosis can be identified using a bone density scan.  If you are 31 years of age or older, or if you are at risk for osteoporosis and fractures, ask your health care provider if you should be screened.  Ask your health care provider whether you should take a calcium or vitamin D supplement to lower your risk for osteoporosis.  Menopause may have certain physical symptoms and risks.  Hormone replacement therapy may reduce some of these symptoms and risks. Talk to your health care provider about whether hormone replacement therapy is right for you. Follow these instructions at home:  Schedule regular health, dental, and eye exams.  Stay current with your immunizations.  Do not use any tobacco products including cigarettes, chewing tobacco, or electronic cigarettes.  If you are pregnant, do not drink alcohol.  If you are breastfeeding, limit how much and how often you drink alcohol.  Limit alcohol intake to no more than 1 drink per day for nonpregnant women. One drink equals 12 ounces of beer, 5 ounces of wine, or 1 ounces of hard liquor.  Do not use street  drugs.  Do not share needles.  Ask your health care provider for help if you need support or information about quitting drugs.  Tell your health care provider if you often feel depressed.  Tell your health care provider if you have ever been abused or do not feel safe at home. This information is not intended to replace advice given to you by your health care provider. Make sure you discuss any questions you have with your health care provider. Document Released: 04/01/2011 Document Revised: 02/22/2016 Document Reviewed: 06/20/2015 Elsevier Interactive Patient Education  [employer].

## 2018-05-18 NOTE — Progress Notes (Signed)
Patient ID: Erika Gardner, female  DOB: April 15, 1976, 42 y.o.   MRN: [id number] Patient Care Team    Relationship Specialty Notifications Start End  Ma Hillock, DO PCP - General Family Medicine  06/01/15   Bjorn Loser, MD Consulting Physician Urology  09/12/16   Cox, De Hollingshead, MD Referring Physician Nurse Practitioner  10/21/16    Comment: Gynecology  Gregary Cromer., DPM  Podiatry  10/21/16   Leeroy Cha, MD Consulting Physician Neurosurgery  10/21/16   Wyatt Portela, MD Consulting Physician Oncology  10/21/16   Emmit Alexanders., MD Referring Physician Surgery  10/21/16   Specialists, [facility]  Gastroenterology  10/21/16   Renelda Loma, OD  Optometry  01/02/17     Chief Complaint  Patient presents with  . Annual Exam    Subjective:  Erika Gardner is a 42 y.o. female present for CPE. All past medical history, surgical history, allergies, family history, immunizations, medications and social history were updated in the electronic medical record today. All recent labs, ED visits and hospitalizations within the last year were reviewed.  Lymphadenopathy: She has had chronic intermittent lymphadenopathy and fatigue with negative work up. Lymph nodes resolve.  Hep panel, HIV normal. US neck does not meet guidelines for biopsy or monitoring. CBC have been normal. 2016 had "atypical lymphs".  Health maintenance: Updated 05/18/18 Colonoscopy: No FHX. Est with GI with EGD and colonoscopy.  Mammogram: 05/2013 right breast mass, benign, routine screen at 40 recommended has GYN and will follow w/ them for mammograms. Cervical cancer screening: last pap: 10/03/2015; normal negative HPV. --> follows with gyn Immunizations: tdap UTD 2016, Influenza declined-forever (encouraged yearly) Infectious disease screening: HIV completed DEXA: N/A Assistive device: none Oxygen TIW:PYKD Patient has a Dental home. Hospitalizations/ED visits: reviewed  Depression screen [facility] 2/9  04/27/2018 11/27/2017 10/02/2017 03/17/2017 10/21/2016  Decreased Interest 0 0 0 0 0  Down, Depressed, Hopeless 0 0 0 0 0  PHQ - 2 Score 0 0 0 0 0  Altered sleeping 0 0 - 3 -  Tired, decreased energy 2 0 - 3 -  Change in appetite 0 0 - 1 -  Feeling bad or failure about yourself  0 0 - 0 -  Trouble concentrating 0 0 - 2 -  Moving slowly or fidgety/restless 0 0 - 0 -  Suicidal thoughts 0 0 - 0 -  PHQ-9 Score 2 0 - 9 -  Difficult doing work/chores Not difficult at all Not difficult at all - - -   GAD 7 : Generalized Anxiety Score 04/27/2018 11/27/2017 10/02/2017 03/17/2017  Nervous, Anxious, on Edge 0 0 0 3  Control/stop worrying 0 0 0 1  Worry too much - different things 0 0 0 1  Trouble relaxing 0 0 0 0  Restless 0 0 0 0  Easily annoyed or irritable 0 0 0 0  Afraid - awful might happen 0 0 0 0  Total GAD 7 Score 0 0 0 5  Anxiety Difficulty - Not difficult at all Not difficult at all -     Current Exercise Habits: The patient does not participate in regular exercise at present   Fall Risk  10/21/2016  Falls in the past year? No      Immunization History  Administered Date(s) Administered  . Tdap 06/01/2015     Past Medical History:  Diagnosis Date  . Abnormal mammogram of right breast 09/2012   stable hypoechoic lesion  . Anxiety   .  Asthma    somewhat sounding like exercise induced relative to temperature   . B12 deficiency   . Carpal tunnel syndrome    both wrists  . Complication of anesthesia    pt. reports that the combination of morphine & / or dilaudid with anesthesia  presents with itching    . Fibromyalgia   . GERD (gastroesophageal reflux disease)   . H/O exercise stress test 03/10/2017   Ef 60-65%, no ischemic changes.   . Hematuria   . Hemochromatosis   . Insomnia   . Neuromuscular disorder ([place])   . Panic attacks   . Seizures ([place])    x2 with a syncopal episode (03/2015- last episode)  . Sleep apnea    not needing treatment, through a sleep study done  showing that if she is on her back she has slight sleep apnea.    . Snoring   . Thyroid disease    multinodular goiter; followed for > 5 years, unchanged. No further imaging needed 09/2016.  Marland Kitchen Vertigo   . Vitamin D deficiency    Allergies  Allergen Reactions  . Savella [Milnacipran Hcl] Other (See Comments)    MOODY ANGRY PALPITATIONS  . Hydromorphone Hcl Itching   Past Surgical History:  Procedure Laterality Date  . APPENDECTOMY    . CHOLECYSTECTOMY  2014  . DILATION AND CURETTAGE OF UTERUS  2007   Post miscarriage  . HERNIA REPAIR     umbilicus  . KNEE ARTHROSCOPY Right 2014   wear and tear  . LUMBAR LAMINECTOMY/DECOMPRESSION MICRODISCECTOMY Bilateral 05/10/2016   Procedure: Bilateral Lumbar five-sacral one Microdiskectomy;  Surgeon: Leeroy Cha, MD;  Location: [facility];  Service: Neurosurgery;  Laterality: Bilateral;  bilateral  . VAGINAL DELIVERY     x3   Family History  Problem Relation Age of Onset  . Arthritis Maternal Grandmother   . Hearing loss Maternal Grandmother   . Cancer Maternal Grandfather        skin  . Heart disease Maternal Grandfather   . Arthritis Paternal Grandmother   . Cancer Paternal Grandfather        kidney  . Heart disease Paternal Grandfather   . Kidney disease Paternal Grandfather    Social History   Socioeconomic History  . Marital status: Divorced    Spouse name: Not on file  . Number of children: Not on file  . Years of education: Not on file  . Highest education level: Not on file  Occupational History  . Not on file  Social Needs  . Financial resource strain: Not on file  . Food insecurity:    Worry: Not on file    Inability: Not on file  . Transportation needs:    Medical: Not on file    Non-medical: Not on file  Tobacco Use  . Smoking status: Never Smoker  . Smokeless tobacco: Never Used  Substance and Sexual Activity  . Alcohol use: Yes    Comment: socially- occasional- 4-5 x in a month    . Drug use: No    . Sexual activity: Yes  Lifestyle  . Physical activity:    Days per week: Not on file    Minutes per session: Not on file  . Stress: Not on file  Relationships  . Social connections:    Talks on phone: Not on file    Gets together: Not on file    Attends religious service: Not on file    Active member of club or organization:  Not on file    Attends meetings of clubs or organizations: Not on file    Relationship status: Not on file  . Intimate partner violence:    Fear of current or ex partner: Not on file    Emotionally abused: Not on file    Physically abused: Not on file    Forced sexual activity: Not on file  Other Topics Concern  . Not on file  Social History Narrative   Married. 3 children, stay at home mother. Father in law live with them as well. No alcohol, former smoker. No recreational drugs. Wears her seatbelt. Wears a bike helmet. Exercises at least 3 times a week. Smoke alarm in the home. There are firearms in the home.   Allergies as of 05/18/2018      Reactions   Savella [milnacipran Hcl] Other (See Comments)   MOODY ANGRY PALPITATIONS   Hydromorphone Hcl Itching      Medication List        Accurate as of 05/18/18 12:32 PM. Always use your most recent med list.          albuterol 108 (90 Base) MCG/ACT inhaler Commonly known as:  PROVENTIL HFA;VENTOLIN HFA Inhale 2 puffs into the lungs every 6 (six) hours as needed for wheezing or shortness of breath.   DEXILANT 60 MG capsule Generic drug:  dexlansoprazole Take by mouth.   escitalopram 10 MG tablet Commonly known as:  LEXAPRO Take 1 tablet (10 mg total) by mouth at bedtime.   fluticasone 50 MCG/ACT nasal spray Commonly known as:  FLONASE Place into both nostrils daily.   JUNEL 1/20 1-20 MG-MCG tablet Generic drug:  norethindrone-ethinyl estradiol Take 1 tablet by mouth daily.   ranitidine 150 MG tablet Commonly known as:  ZANTAC Take 1 tablet (150 mg total) by mouth 2 (two) times daily.    traZODone 50 MG tablet Commonly known as:  DESYREL Take 1 tablet (50 mg total) by mouth at bedtime.   vitamin B-12 1000 MCG tablet Commonly known as:  CYANOCOBALAMIN Take 1 tablet (1,000 mcg total) by mouth daily.   Vitamin D3 1000 units Caps Take 3,000 Units by mouth.      All past medical history, surgical history, allergies, family history, immunizations andmedications were updated in the EMR today and reviewed under the history and medication portions of their EMR.     No results found for this or any previous visit (from the past 2160 hour(s)).  No results found.   ROS: 14 pt review of systems performed and negative (unless mentioned in an HPI)  Objective: BP 107/75 (BP Location: Left Arm, Patient Position: Sitting, Cuff Size: Normal)   Pulse 80   Temp 98.8 F (37.1 C) (Oral)   Resp 16   Ht 5' 5"  (1.651 m)   Wt 149 lb (67.6 kg)   SpO2 98%   BMI 24.79 kg/m  Gen: Afebrile. No acute distress. Nontoxic in appearance, well-developed, well-nourished,  Pleasant caucasian female.  HENT: AT. [place]. Bilateral TM visualized and normal in appearance, normal external auditory canal. MMM, no oral lesions, adequate dentition. Bilateral nares within normal limits. Throat without erythema, ulcerations or exudates. mild Cough on exam, no hoarseness on exam. Eyes:Pupils Equal Round Reactive to light, Extraocular movements intact,  Conjunctiva without redness, discharge or icterus. Neck/lymp/endocrine: Supple,no lymphadenopathy appreciated, no thyromegaly CV: RRR no murmur, no edema, +2/4 P posterior tibialis pulses.  Chest: CTAB, no wheeze, rhonchi or crackles. normal Respiratory effort. good Air movement. Abd: Soft. flat.  NTND. BS present. no Masses palpated. No hepatosplenomegaly. No rebound tenderness or guarding. Skin: no rashes, purpura or petechiae. Warm and well-perfused. Skin intact. Neuro/Msk:  Normal gait. PERLA. EOMi. Alert. Oriented x3.  Cranial nerves II through XII intact.  Muscle strength 5/5 upper/lower extremity. DTRs equal bilaterally. Psych: Normal affect, dress and demeanor. Normal speech. Normal thought content and judgment.  No exam data present  Assessment/plan: Erika Gardner is a 42 y.o. female present for CPE Fatigue, unspecified type/lymphadenopathy/Hemochromatosis, unspecified hemochromatosis type - lymphadenopathy and fatigue has been intermittent and chronic.  - Vitamin D (25 hydroxy) - B12 - TSH - Comp Met (CMET) - Epstein-Barr virus VCA antibody panel - CMV IgM - CMP - CBC Vitamin D deficiency B12 deficiency - Vitamin D (25 hydroxy) - B12 Screening cholesterol level - Lipid panel Diabetes mellitus screening - HgB A1c Encounter for general adult medical examination with abnormal findings Patient was encouraged to exercise greater than 150 minutes a week. Patient was encouraged to choose a diet filled with fresh fruits and vegetables, and lean meats. AVS provided to patient today for education/recommendation on gender specific health and safety maintenance. Mammogram: 05/2013 right breast mass, benign, routine screen at 41 recommended has GYN and will follow w/ them for mammograms. Cervical cancer screening: last pap: 10/03/2015; normal negative HPV. --> follows with gyn Immunizations: tdap UTD 2016, Influenza declined-forever (encouraged yearly) Infectious disease screening: HIV completed  Return in about 1 year (around 05/19/2019) for CPE. 6 month [facility]  Note is dictated utilizing voice recognition software. Although note has been proof read prior to signing, occasional typographical errors still can be missed. If any questions arise, please do not hesitate to call for verification.  Electronically signed by: Howard Pouch, DO [place]

## 2018-05-19 LAB — EPSTEIN-BARR VIRUS VCA ANTIBODY PANEL
EBV NA IGG: 475 U/mL — AB
EBV VCA IgM: <36 U/mL

## 2018-05-19 LAB — IRON,TIBC AND FERRITIN PANEL
%SAT: 22 % (ref 16–45)
Ferritin: 27 ng/mL (ref 16–232)
Iron: 87 ug/dL (ref 40–190)
TIBC: 398 ug/dL (ref 250–450)

## 2018-05-19 LAB — CMV IGM

## 2018-05-21 ENCOUNTER — Telehealth: Payer: Self-pay | Admitting: Family Medicine

## 2018-05-21 ENCOUNTER — Encounter: Payer: Self-pay | Admitting: Family Medicine

## 2018-05-21 NOTE — Telephone Encounter (Signed)
Please inform patient the following information: Has labs are all normal for cell counts and organ function. Her mono/EBV and CMV panel were also normal- showed prior exposure in life, but not current reactivation.  Her iron is actually lower end of normal.  Vit d and b12 normal.  Thyroid normal.   Please encourage her if she feels her enlarged lymph nodes do not resolve in 2-4 weeks to make an appt to follow up so me can further evaluate them.

## 2018-05-21 NOTE — Telephone Encounter (Signed)
Patient notified and verbalized understanding. 

## 2018-05-22 DIAGNOSIS — M9903 Segmental and somatic dysfunction of lumbar region: Secondary | ICD-10-CM | POA: Diagnosis not present

## 2018-05-22 DIAGNOSIS — M9902 Segmental and somatic dysfunction of thoracic region: Secondary | ICD-10-CM | POA: Diagnosis not present

## 2018-05-22 DIAGNOSIS — M9901 Segmental and somatic dysfunction of cervical region: Secondary | ICD-10-CM | POA: Diagnosis not present

## 2018-05-22 DIAGNOSIS — M5441 Lumbago with sciatica, right side: Secondary | ICD-10-CM | POA: Diagnosis not present

## 2018-05-27 DIAGNOSIS — M9901 Segmental and somatic dysfunction of cervical region: Secondary | ICD-10-CM | POA: Diagnosis not present

## 2018-05-27 DIAGNOSIS — M5441 Lumbago with sciatica, right side: Secondary | ICD-10-CM | POA: Diagnosis not present

## 2018-05-27 DIAGNOSIS — M9903 Segmental and somatic dysfunction of lumbar region: Secondary | ICD-10-CM | POA: Diagnosis not present

## 2018-05-27 DIAGNOSIS — M9902 Segmental and somatic dysfunction of thoracic region: Secondary | ICD-10-CM | POA: Diagnosis not present

## 2018-06-08 DIAGNOSIS — M9903 Segmental and somatic dysfunction of lumbar region: Secondary | ICD-10-CM | POA: Diagnosis not present

## 2018-06-08 DIAGNOSIS — M9901 Segmental and somatic dysfunction of cervical region: Secondary | ICD-10-CM | POA: Diagnosis not present

## 2018-06-08 DIAGNOSIS — M5441 Lumbago with sciatica, right side: Secondary | ICD-10-CM | POA: Diagnosis not present

## 2018-06-08 DIAGNOSIS — M9902 Segmental and somatic dysfunction of thoracic region: Secondary | ICD-10-CM | POA: Diagnosis not present

## 2018-07-02 ENCOUNTER — Ambulatory Visit: Payer: BLUE CROSS/BLUE SHIELD | Admitting: Family Medicine

## 2018-07-06 ENCOUNTER — Ambulatory Visit: Payer: BLUE CROSS/BLUE SHIELD | Admitting: Family Medicine

## 2018-07-06 ENCOUNTER — Encounter: Payer: Self-pay | Admitting: Family Medicine

## 2018-07-06 VITALS — BP 111/76 | HR 85 | Temp 98.5°F | Resp 18 | Ht 65.0 in | Wt 153.0 lb

## 2018-07-06 DIAGNOSIS — R591 Generalized enlarged lymph nodes: Secondary | ICD-10-CM

## 2018-07-06 MED ORDER — ESCITALOPRAM OXALATE 20 MG PO TABS: 20.0000 mg | ORAL_TABLET | Freq: Every day | ORAL | 0 refills | Status: DC

## 2018-07-06 NOTE — Patient Instructions (Signed)
They will call to schedule your ultrasound of your neck.  I did refill your med--> keep your follow up in 3 months.   Lymphadenopathy Lymphadenopathy refers to swollen or enlarged lymph glands, also called lymph nodes. Lymph glands are part of your body's defense (immune) system, which protects the body from infections, germs, and diseases. Lymph glands are found in many locations in your body, including the neck, underarm, and groin. Many things can cause lymph glands to become enlarged. When your immune system responds to germs, such as viruses or bacteria, infection-fighting cells and fluid build up. This causes the glands to grow in size. Usually, this is not something to worry about. The swelling and any soreness often go away without treatment. However, swollen lymph glands can also be caused by a number of diseases. Your health care provider may do various tests to help determine the cause. If the cause of your swollen lymph glands cannot be found, it is important to monitor your condition to make sure the swelling goes away. Follow these instructions at home: Watch your condition for any changes. The following actions may help to lessen any discomfort you are feeling:  Get plenty of rest.  Take medicines only as directed by your health care provider. Your health care provider may recommend over-the-counter medicines for pain.  Apply moist heat compresses to the site of swollen lymph nodes as directed by your health care provider. This can help reduce any pain.  Check your lymph nodes daily for any changes.  Keep all follow-up visits as directed by your health care provider. This is important.  Contact a health care provider if:  Your lymph nodes are still swollen after 2 weeks.  Your swelling increases or spreads to other areas.  Your lymph nodes are hard, seem fixed to the skin, or are growing rapidly.  Your skin over the lymph nodes is red and inflamed.  You have a  fever.  You have chills.  You have fatigue.  You develop a sore throat.  You have abdominal pain.  You have weight loss.  You have night sweats. Get help right away if:  You notice fluid leaking from the area of the enlarged lymph node.  You have severe pain in any area of your body.  You have chest pain.  You have shortness of breath. This information is not intended to replace advice given to you by your health care provider. Make sure you discuss any questions you have with your health care provider. Document Released: 06/25/2008 Document Revised: 02/22/2016 Document Reviewed: 04/21/2014 Elsevier Interactive Patient Education  [employer].

## 2018-07-06 NOTE — Progress Notes (Signed)
Erika Gardner , 1976-04-07, 42 y.o., female MRN: [id number] Patient Care Team    Relationship Specialty Notifications Start End  Natalia Leatherwood, DO PCP - General Family Medicine  06/01/15   Alfredo Martinez, MD Consulting Physician Urology  09/12/16   Cox, Ronnald Collum, MD Referring Physician Nurse Practitioner  10/21/16    Comment: Gynecology  Ivery Quale., DPM  Podiatry  10/21/16   Hilda Lias, MD Consulting Physician Neurosurgery  10/21/16   Benjiman Core, MD Consulting Physician Oncology  10/21/16   Glenis Smoker., MD Referring Physician Surgery  10/21/16   [facility]  Gastroenterology  10/21/16   Tanya Nones, OD  Optometry  01/02/17     Chief Complaint  Patient presents with  . Adenopathy    neck     Subjective: Pt presents for an OV with complaints of lymphadenopathy.  Lymphadenopathy: She has had chronic intermittent lymphadenopathy and fatigue with negative work up. L Hep panel, HIV normal.  CBC have been normal. 2016 had "atypical lymphs". All other labs lat visit viral panels etc were normal. Lymph nodes are still present despite lack of URI symptoms now. She has concerns they are related to her fatigue. She denies fever, chills, rash, unintentional weight loss or night sweats.   Anxiety/insomnia: Pt returns today to follow up on her anxiety and insomnia. She reports compliance with lexapro 10 mg qhs and trazodone 25-50 qhs. She is sleeping well on this regimen and feels her anxiety is well controlled  Depression screen Erika Gardner 2/9 04/27/2018 11/27/2017 10/02/2017 03/17/2017 10/21/2016  Decreased Interest 0 0 0 0 0  Down, Depressed, Hopeless 0 0 0 0 0  PHQ - 2 Score 0 0 0 0 0  Altered sleeping 0 0 - 3 -  Tired, decreased energy 2 0 - 3 -  Change in appetite 0 0 - 1 -  Feeling bad or failure about yourself  0 0 - 0 -  Trouble concentrating 0 0 - 2 -  Moving slowly or fidgety/restless 0 0 - 0 -  Suicidal thoughts 0 0 - 0 -  PHQ-9 Score 2 0 - 9 -    Difficult doing work/chores Not difficult at all Not difficult at all - - -    Allergies  Allergen Reactions  . Savella [Milnacipran Hcl] Other (See Comments)    MOODY ANGRY PALPITATIONS  . Hydromorphone Hcl Itching   Social History   Tobacco Use  . Smoking status: Never Smoker  . Smokeless tobacco: Never Used  Substance Use Topics  . Alcohol use: Yes    Comment: socially- occasional- 4-5 x in a month     Past Medical History:  Diagnosis Date  . Abnormal mammogram of right breast 09/2012   stable hypoechoic lesion  . Anxiety   . Asthma    somewhat sounding like exercise induced relative to temperature   . B12 deficiency   . Carpal tunnel syndrome    both wrists  . Complication of anesthesia    pt. reports that the combination of morphine & / or dilaudid with anesthesia  presents with itching    . Fibromyalgia   . GERD (gastroesophageal reflux disease)   . H/O exercise stress test 03/10/2017   Ef 60-65%, no ischemic changes.   . Hematuria   . Hemochromatosis   . Insomnia   . Neuromuscular disorder (HCC)   . Panic attacks   . Seizures (HCC)    x2 with a syncopal episode (03/2015-  last episode)  . Sleep apnea    not needing treatment, through a sleep study done showing that if she is on her back she has slight sleep apnea.    . Snoring   . Thyroid disease    multinodular goiter; followed for > 5 years, unchanged. No further imaging needed 09/2016.  Marland Kitchen Vertigo   . Vitamin D deficiency    Past Surgical History:  Procedure Laterality Date  . APPENDECTOMY    . CHOLECYSTECTOMY  2014  . DILATION AND CURETTAGE OF UTERUS  2007   Post miscarriage  . HERNIA REPAIR     umbilicus  . KNEE ARTHROSCOPY Right 2014   wear and tear  . LUMBAR LAMINECTOMY/DECOMPRESSION MICRODISCECTOMY Bilateral 05/10/2016   Procedure: Bilateral Lumbar five-sacral one Microdiskectomy;  Surgeon: Hilda Lias, MD;  Location: [facility];  Service: Neurosurgery;  Laterality: Bilateral;  bilateral   . VAGINAL DELIVERY     x3   Family History  Problem Relation Age of Onset  . Arthritis Maternal Grandmother   . Hearing loss Maternal Grandmother   . Cancer Maternal Grandfather        skin  . Heart disease Maternal Grandfather   . Arthritis Paternal Grandmother   . Cancer Paternal Grandfather        kidney  . Heart disease Paternal Grandfather   . Kidney disease Paternal Grandfather    Allergies as of 07/06/2018      Reactions   Savella [milnacipran Hcl] Other (See Comments)   MOODY ANGRY PALPITATIONS   Hydromorphone Hcl Itching      Medication List        Accurate as of 07/06/18 10:58 AM. Always use your most recent med list.          albuterol 108 (90 Base) MCG/ACT inhaler Commonly known as:  PROVENTIL HFA;VENTOLIN HFA Inhale 2 puffs into the lungs every 6 (six) hours as needed for wheezing or shortness of breath.   DEXILANT 60 MG capsule Generic drug:  dexlansoprazole Take by mouth.   escitalopram 10 MG tablet Commonly known as:  LEXAPRO Take 1 tablet (10 mg total) by mouth at bedtime.   fluticasone 50 MCG/ACT nasal spray Commonly known as:  FLONASE Place into both nostrils daily.   JUNEL 1/20 1-20 MG-MCG tablet Generic drug:  norethindrone-ethinyl estradiol Take 1 tablet by mouth daily.   traZODone 50 MG tablet Commonly known as:  DESYREL Take 1 tablet (50 mg total) by mouth at bedtime.   vitamin B-12 1000 MCG tablet Commonly known as:  CYANOCOBALAMIN Take 1 tablet (1,000 mcg total) by mouth daily.   Vitamin D3 1000 units Caps Take 3,000 Units by mouth.       All past medical history, surgical history, allergies, family history, immunizations andmedications were updated in the EMR today and reviewed under the history and medication portions of their EMR.     ROS: Negative, with the exception of above mentioned in HPI   Objective:  BP 111/76 (BP Location: Left Arm, Patient Position: Sitting, Cuff Size: Normal)   Pulse 85   Temp 98.5 F  (36.9 C)   Resp 18   Ht 5\' 5"  (1.651 m)   Wt 153 lb (69.4 kg)   SpO2 96%   BMI 25.46 kg/m  Body mass index is 25.46 kg/m. Gen: Afebrile. No acute distress. Nontoxic in appearance, well developed, well nourished.  HENT: AT. [place].  MMM, no oral lesions. Bilateral nares without erythema or drainage. Throat without erythema or exudates. No  cough or hoarseness.  Eyes:Pupils Equal Round Reactive to light, Extraocular movements intact,  Conjunctiva without redness, discharge or icterus. Neck/lymp/endocrine: Supple,bilateral cervical/submandibular  lymphadenopathy Skin: no rashes, purpura or petechiae.  Neuro:  Normal gait. PERLA. EOMi. Alert. Oriented x3  Psych: Normal affect, dress and demeanor. Normal speech. Normal thought content and judgment.  No exam data present No results found. No results found for this or any previous visit (from the past 24 hour(s)).  Assessment/Plan: Zamariyah Furukawa is a 42 y.o. female present for OV for  Fatigue, unspecified type/lymphadenopathy/Hemochromatosis, unspecified hemochromatosis type - lymphadenopathy and fatigue has been intermittent and chronic.  - all labs reassuring.  - lymph nodes still present. Dedicated neck US ordered today for lymph node eval.   Generalized anxiety disorder/insomnia - increased lexapro on her own. Refilled lexapro 20 mg QD. Continue trazodone.  - f/u 3 mos  Reviewed expectations re: course of current medical issues.  Discussed self-management of symptoms.  Outlined signs and symptoms indicating need for more acute intervention.  Patient verbalized understanding and all questions were answered.  Patient received an After-Visit Summary.    No orders of the defined types were placed in this encounter.    Note is dictated utilizing voice recognition software. Although note has been proof read prior to signing, occasional typographical errors still can be missed. If any questions arise, please do not hesitate to  call for verification.   electronically signed by:  Felix Pacini, DO  [facility] - [place]

## 2018-07-09 ENCOUNTER — Telehealth: Payer: Self-pay | Admitting: Family Medicine

## 2018-07-09 NOTE — Telephone Encounter (Signed)
Patient is calling to speak to [facility].  She received the message and wanted to clarify.  She says that the nodules have not gone down since she saw Dr. Claiborne Billings on Monday.   A couple of years ago the same thing happened.  They ended up going.  Dennie Bible wants to wait to see if they go away. Or does she want her to wait.  Please advise.

## 2018-07-09 NOTE — Telephone Encounter (Signed)
If it went away she does not need a ultrasound, even if it comes back and goes away again--> that is normal. We only need to [place] if a node remains or becomes larger instead of resolving.  If it went away that is good news and order can be canceled.d

## 2018-07-09 NOTE — Telephone Encounter (Signed)
Left detailed message with information and instructions on patient voice mail per DPR 

## 2018-07-09 NOTE — Telephone Encounter (Signed)
I called patient to schedule ultrasound. While on the phone patient asked if she could wait 3 months to have the ultrasound. The swelling has gone down. She said it has happened to her before where the swelling went down before. Patient requests call back.

## 2018-07-13 ENCOUNTER — Encounter: Payer: Self-pay | Admitting: *Deleted

## 2018-07-13 NOTE — Telephone Encounter (Signed)
Sent patient information and instructions in [facility] Chart message.

## 2018-07-21 DIAGNOSIS — G5600 Carpal tunnel syndrome, unspecified upper limb: Secondary | ICD-10-CM | POA: Insufficient documentation

## 2018-07-21 DIAGNOSIS — E079 Disorder of thyroid, unspecified: Secondary | ICD-10-CM | POA: Insufficient documentation

## 2018-07-21 DIAGNOSIS — J0140 Acute pansinusitis, unspecified: Secondary | ICD-10-CM | POA: Diagnosis not present

## 2018-08-04 DIAGNOSIS — M9901 Segmental and somatic dysfunction of cervical region: Secondary | ICD-10-CM | POA: Diagnosis not present

## 2018-08-04 DIAGNOSIS — M6240 Contracture of muscle, unspecified site: Secondary | ICD-10-CM | POA: Diagnosis not present

## 2018-08-04 DIAGNOSIS — M5441 Lumbago with sciatica, right side: Secondary | ICD-10-CM | POA: Diagnosis not present

## 2018-08-04 DIAGNOSIS — M9902 Segmental and somatic dysfunction of thoracic region: Secondary | ICD-10-CM | POA: Diagnosis not present

## 2018-08-06 DIAGNOSIS — M9901 Segmental and somatic dysfunction of cervical region: Secondary | ICD-10-CM | POA: Diagnosis not present

## 2018-08-06 DIAGNOSIS — M5441 Lumbago with sciatica, right side: Secondary | ICD-10-CM | POA: Diagnosis not present

## 2018-08-06 DIAGNOSIS — M9902 Segmental and somatic dysfunction of thoracic region: Secondary | ICD-10-CM | POA: Diagnosis not present

## 2018-08-06 DIAGNOSIS — M6240 Contracture of muscle, unspecified site: Secondary | ICD-10-CM | POA: Diagnosis not present

## 2018-08-13 DIAGNOSIS — M6240 Contracture of muscle, unspecified site: Secondary | ICD-10-CM | POA: Diagnosis not present

## 2018-08-13 DIAGNOSIS — M9901 Segmental and somatic dysfunction of cervical region: Secondary | ICD-10-CM | POA: Diagnosis not present

## 2018-08-13 DIAGNOSIS — M5441 Lumbago with sciatica, right side: Secondary | ICD-10-CM | POA: Diagnosis not present

## 2018-08-13 DIAGNOSIS — M9902 Segmental and somatic dysfunction of thoracic region: Secondary | ICD-10-CM | POA: Diagnosis not present

## 2018-09-30 DIAGNOSIS — R937 Abnormal findings on diagnostic imaging of other parts of musculoskeletal system: Secondary | ICD-10-CM

## 2018-09-30 HISTORY — DX: Abnormal findings on diagnostic imaging of other parts of musculoskeletal system: R93.7

## 2018-10-08 DIAGNOSIS — M9902 Segmental and somatic dysfunction of thoracic region: Secondary | ICD-10-CM | POA: Diagnosis not present

## 2018-10-08 DIAGNOSIS — M6240 Contracture of muscle, unspecified site: Secondary | ICD-10-CM | POA: Diagnosis not present

## 2018-10-08 DIAGNOSIS — M9901 Segmental and somatic dysfunction of cervical region: Secondary | ICD-10-CM | POA: Diagnosis not present

## 2018-10-08 DIAGNOSIS — M5441 Lumbago with sciatica, right side: Secondary | ICD-10-CM | POA: Diagnosis not present

## 2018-10-09 ENCOUNTER — Other Ambulatory Visit: Payer: Self-pay

## 2018-10-09 MED ORDER — ESCITALOPRAM OXALATE 20 MG PO TABS: 20.0000 mg | ORAL_TABLET | Freq: Every day | ORAL | 0 refills | Status: DC

## 2018-10-09 NOTE — Telephone Encounter (Signed)
Pt pharmacy is calling requesting a rf on pt Escitalopram. Per last OV note in Oct pt is to FU in 3 mo for anxiety. Will rf #30 w no rfs and pt notifid needs an appt prior to any refills.

## 2018-10-14 DIAGNOSIS — M9903 Segmental and somatic dysfunction of lumbar region: Secondary | ICD-10-CM | POA: Diagnosis not present

## 2018-10-14 DIAGNOSIS — M5441 Lumbago with sciatica, right side: Secondary | ICD-10-CM | POA: Diagnosis not present

## 2018-10-14 DIAGNOSIS — M5137 Other intervertebral disc degeneration, lumbosacral region: Secondary | ICD-10-CM | POA: Diagnosis not present

## 2018-10-14 DIAGNOSIS — M9905 Segmental and somatic dysfunction of pelvic region: Secondary | ICD-10-CM | POA: Diagnosis not present

## 2018-10-20 DIAGNOSIS — R350 Frequency of micturition: Secondary | ICD-10-CM | POA: Diagnosis not present

## 2018-10-20 DIAGNOSIS — R200 Anesthesia of skin: Secondary | ICD-10-CM | POA: Diagnosis not present

## 2018-10-20 DIAGNOSIS — M5137 Other intervertebral disc degeneration, lumbosacral region: Secondary | ICD-10-CM | POA: Diagnosis not present

## 2018-10-20 DIAGNOSIS — M545 Low back pain: Secondary | ICD-10-CM | POA: Diagnosis not present

## 2018-10-20 DIAGNOSIS — M5410 Radiculopathy, site unspecified: Secondary | ICD-10-CM | POA: Diagnosis not present

## 2018-10-20 DIAGNOSIS — M2578 Osteophyte, vertebrae: Secondary | ICD-10-CM | POA: Diagnosis not present

## 2018-10-22 DIAGNOSIS — M5416 Radiculopathy, lumbar region: Secondary | ICD-10-CM | POA: Diagnosis not present

## 2018-10-23 ENCOUNTER — Other Ambulatory Visit: Payer: Self-pay

## 2018-10-26 DIAGNOSIS — M9903 Segmental and somatic dysfunction of lumbar region: Secondary | ICD-10-CM | POA: Diagnosis not present

## 2018-10-26 DIAGNOSIS — M5137 Other intervertebral disc degeneration, lumbosacral region: Secondary | ICD-10-CM | POA: Diagnosis not present

## 2018-10-26 DIAGNOSIS — M5441 Lumbago with sciatica, right side: Secondary | ICD-10-CM | POA: Diagnosis not present

## 2018-10-26 DIAGNOSIS — M9905 Segmental and somatic dysfunction of pelvic region: Secondary | ICD-10-CM | POA: Diagnosis not present

## 2018-10-28 DIAGNOSIS — M9903 Segmental and somatic dysfunction of lumbar region: Secondary | ICD-10-CM | POA: Diagnosis not present

## 2018-10-28 DIAGNOSIS — M5441 Lumbago with sciatica, right side: Secondary | ICD-10-CM | POA: Diagnosis not present

## 2018-10-28 DIAGNOSIS — M5137 Other intervertebral disc degeneration, lumbosacral region: Secondary | ICD-10-CM | POA: Diagnosis not present

## 2018-10-28 DIAGNOSIS — M9905 Segmental and somatic dysfunction of pelvic region: Secondary | ICD-10-CM | POA: Diagnosis not present

## 2018-10-30 DIAGNOSIS — M797 Fibromyalgia: Secondary | ICD-10-CM | POA: Diagnosis not present

## 2018-10-30 DIAGNOSIS — F411 Generalized anxiety disorder: Secondary | ICD-10-CM | POA: Diagnosis not present

## 2018-10-30 DIAGNOSIS — M5416 Radiculopathy, lumbar region: Secondary | ICD-10-CM | POA: Diagnosis not present

## 2018-10-30 DIAGNOSIS — M461 Sacroiliitis, not elsewhere classified: Secondary | ICD-10-CM | POA: Diagnosis not present

## 2018-11-04 DIAGNOSIS — M5416 Radiculopathy, lumbar region: Secondary | ICD-10-CM | POA: Diagnosis not present

## 2018-11-04 DIAGNOSIS — Z6826 Body mass index (BMI) 26.0-26.9, adult: Secondary | ICD-10-CM | POA: Diagnosis not present

## 2018-11-11 DIAGNOSIS — M5441 Lumbago with sciatica, right side: Secondary | ICD-10-CM | POA: Diagnosis not present

## 2018-11-11 DIAGNOSIS — M9903 Segmental and somatic dysfunction of lumbar region: Secondary | ICD-10-CM | POA: Diagnosis not present

## 2018-11-11 DIAGNOSIS — M5137 Other intervertebral disc degeneration, lumbosacral region: Secondary | ICD-10-CM | POA: Diagnosis not present

## 2018-11-11 DIAGNOSIS — M9905 Segmental and somatic dysfunction of pelvic region: Secondary | ICD-10-CM | POA: Diagnosis not present

## 2018-11-19 DIAGNOSIS — M5137 Other intervertebral disc degeneration, lumbosacral region: Secondary | ICD-10-CM | POA: Diagnosis not present

## 2018-11-19 DIAGNOSIS — M9903 Segmental and somatic dysfunction of lumbar region: Secondary | ICD-10-CM | POA: Diagnosis not present

## 2018-11-19 DIAGNOSIS — M9905 Segmental and somatic dysfunction of pelvic region: Secondary | ICD-10-CM | POA: Diagnosis not present

## 2018-11-19 DIAGNOSIS — M5441 Lumbago with sciatica, right side: Secondary | ICD-10-CM | POA: Diagnosis not present

## 2018-11-21 ENCOUNTER — Other Ambulatory Visit: Payer: Self-pay | Admitting: Family Medicine

## 2018-11-25 DIAGNOSIS — M9903 Segmental and somatic dysfunction of lumbar region: Secondary | ICD-10-CM | POA: Diagnosis not present

## 2018-11-25 DIAGNOSIS — M5441 Lumbago with sciatica, right side: Secondary | ICD-10-CM | POA: Diagnosis not present

## 2018-11-25 DIAGNOSIS — M5137 Other intervertebral disc degeneration, lumbosacral region: Secondary | ICD-10-CM | POA: Diagnosis not present

## 2018-11-25 DIAGNOSIS — M9905 Segmental and somatic dysfunction of pelvic region: Secondary | ICD-10-CM | POA: Diagnosis not present

## 2018-11-26 NOTE — Telephone Encounter (Signed)
Patient was reached and follow up appt scheduled 12/03/18 at 8:00 with Dr.Kuneff.

## 2018-12-02 DIAGNOSIS — M9905 Segmental and somatic dysfunction of pelvic region: Secondary | ICD-10-CM | POA: Diagnosis not present

## 2018-12-02 DIAGNOSIS — M5441 Lumbago with sciatica, right side: Secondary | ICD-10-CM | POA: Diagnosis not present

## 2018-12-02 DIAGNOSIS — M9903 Segmental and somatic dysfunction of lumbar region: Secondary | ICD-10-CM | POA: Diagnosis not present

## 2018-12-02 DIAGNOSIS — M5137 Other intervertebral disc degeneration, lumbosacral region: Secondary | ICD-10-CM | POA: Diagnosis not present

## 2018-12-03 ENCOUNTER — Encounter: Payer: Self-pay | Admitting: Family Medicine

## 2018-12-03 ENCOUNTER — Ambulatory Visit: Payer: BLUE CROSS/BLUE SHIELD | Admitting: Family Medicine

## 2018-12-03 VITALS — BP 95/63 | HR 74 | Temp 98.0°F | Resp 16 | Ht 65.0 in | Wt 164.0 lb

## 2018-12-03 DIAGNOSIS — J4520 Mild intermittent asthma, uncomplicated: Secondary | ICD-10-CM | POA: Diagnosis not present

## 2018-12-03 DIAGNOSIS — K219 Gastro-esophageal reflux disease without esophagitis: Secondary | ICD-10-CM | POA: Diagnosis not present

## 2018-12-03 DIAGNOSIS — G4700 Insomnia, unspecified: Secondary | ICD-10-CM | POA: Diagnosis not present

## 2018-12-03 DIAGNOSIS — F411 Generalized anxiety disorder: Secondary | ICD-10-CM | POA: Diagnosis not present

## 2018-12-03 DIAGNOSIS — E663 Overweight: Secondary | ICD-10-CM

## 2018-12-03 MED ORDER — ESCITALOPRAM OXALATE 10 MG PO TABS: 10.0000 mg | ORAL_TABLET | Freq: Every day | ORAL | 1 refills | Status: AC

## 2018-12-03 MED ORDER — TRAZODONE HCL 50 MG PO TABS: 50.0000 mg | ORAL_TABLET | Freq: Every day | ORAL | 1 refills | Status: AC

## 2018-12-03 MED ORDER — ALBUTEROL SULFATE HFA 108 (90 BASE) MCG/ACT IN AERS: 2.0000 | INHALATION_SPRAY | Freq: Four times a day (QID) | RESPIRATORY_TRACT | 0 refills | Status: AC | PRN

## 2018-12-03 MED ORDER — OMEPRAZOLE 40 MG PO CPDR: 40.0000 mg | DELAYED_RELEASE_CAPSULE | Freq: Every day | ORAL | 1 refills | Status: AC

## 2018-12-03 NOTE — Progress Notes (Signed)
Patient ID: Erika Gardner, female   DOB: Nov 25, 1975, 43 y.o.   MRN: [id number]   Subjective:    Patient ID: Erika Gardner, female    DOB: 06-Nov-1975, 43 y.o.   MRN: [id number]    Chief Complaint  Patient presents with  . Anxiety    Not fasting. No complaints. GYN exam scheduled 12/13/2018.   Marland Kitchen Gastroesophageal Reflux    Pt would like Prilosec as a RX vs buying OTC    GERD: Using Prilosec and dexilant with flares only. Doing well. Had been on zantac as well- recalled. She needs refills today  Anxiety/insomnia: Pt returns today to follow up on her anxiety and insomnia. She reports compliance with lexapro 10 mg qhs and trazodone 25-50 qhs. She is sleeping well on this regimen and feels her anxiety is wwell controlled. She had been on lexapro 20 mg and starting cutting in half- back to 10 mg 2/2 to feeling blunted.  Asthma: controlled. Inhaler expires in a few months.   Prior note 02/2017: Pt presents for anxiety follow up. She reports she is going through more stress than usual, not sleeping as well, more fatigued. She and her husband are having some issues, it is the year mark of her father's death. She had ol d ativan script and took one before bed and it did help her sleep better. Without the medication she is waking through out the night with anxiety. She is wondering if her Vit d or b12 levels have something to do with her fatigue. Both of which were checked in the last 6 months and looked great, she has continued the same supplements daily. She reports compliance with Lexapro 10 mg QD. She did try to take herself off the medication, but realized she did need it and restarted the 10 mg dose.  Depression screen [facility] 2/9 12/03/2018 04/27/2018 11/27/2017 10/02/2017 03/17/2017  Decreased Interest 0 0 0 0 0  Down, Depressed, Hopeless 0 0 0 0 0  PHQ - 2 Score 0 0 0 0 0  Altered sleeping 1 0 0 - 3  Tired, decreased energy 0 2 0 - 3  Change in appetite 0 0 0 - 1  Feeling bad or failure about  yourself  0 0 0 - 0  Trouble concentrating 0 0 0 - 2  Moving slowly or fidgety/restless 0 0 0 - 0  Suicidal thoughts 0 0 0 - 0  PHQ-9 Score 1 2 0 - 9  Difficult doing work/chores Not difficult at all Not difficult at all Not difficult at all - -   GAD 7 : Generalized Anxiety Score 12/03/2018 04/27/2018 11/27/2017 10/02/2017  Nervous, Anxious, on Edge 1 0 0 0  Control/stop worrying 1 0 0 0  Worry too much - different things 1 0 0 0  Trouble relaxing 0 0 0 0  Restless 0 0 0 0  Easily annoyed or irritable 1 0 0 0  Afraid - awful might happen 0 0 0 0  Total GAD 7 Score 4 0 0 0  Anxiety Difficulty Somewhat difficult - Not difficult at all Not difficult at all    Past Medical History:  Diagnosis Date  . Abnormal mammogram of right breast 09/2012   stable hypoechoic lesion  . Abnormal MRI, lumbar spine 09/30/2018   Prior Laminotomy defects bilaterally at L5-S1. No clinically significant foraminal or central canal stenosis. At L5-S1, there is some ventral thichening of the posterior longitudinal ligament that is likely epidual scar, this extends in between the  traversing nerve roots at L5-S1, however it can be well visualized w normal CSF flow along their course without evidence of of stenosis.   . Anxiety   . Asthma    somewhat sounding like exercise induced relative to temperature   . B12 deficiency   . Carpal tunnel syndrome    both wrists  . Complication of anesthesia    pt. reports that the combination of morphine & / or dilaudid with anesthesia  presents with itching    . Fibromyalgia   . GERD (gastroesophageal reflux disease)   . H/O exercise stress test 03/10/2017   Ef 60-65%, no ischemic changes.   . Hematuria   . Hemochromatosis   . Insomnia   . Neuromuscular disorder (HCC)   . Panic attacks   . Seizures (HCC)    x2 with a syncopal episode (03/2015- last episode)  . Sleep apnea    not needing treatment, through a sleep study done showing that if she is on her back she has  slight sleep apnea.    . Snoring   . Thyroid disease    multinodular goiter; followed for > 5 years, unchanged. No further imaging needed 09/2016.  Marland Kitchen Vertigo   . Vitamin D deficiency    Allergies  Allergen Reactions  . Savella [Milnacipran Hcl] Other (See Comments)    MOODY ANGRY PALPITATIONS  . Hydromorphone Hcl Itching    Review of Systems Negative, with the exception of above mentioned in HPI     Objective:   Physical Exam BP 95/63 (BP Location: Left Arm, Patient Position: Sitting, Cuff Size: Normal)   Pulse 74   Temp 98 F (36.7 C) (Oral)   Resp 16   Ht 5\' 5"  (1.651 m)   Wt 164 lb (74.4 kg)   LMP 11/09/2018   SpO2 99%   BMI 27.29 kg/m  Gen: Afebrile. No acute distress. Nontoxic.  HENT: AT. [place].  MMM. Eyes:Pupils Equal Round Reactive to light, Extraocular movements intact,  Conjunctiva without redness, discharge or icterus. Neck/lymp/endocrine: Supple,no lymphadenopathy, no thyromegaly CV: RRR no murmur, no edema, +2/4 P posterior tibialis pulses Chest: CTAB, no wheeze or crackles Skin: no rashes, purpura or petechiae.  Neuro: Normal gait. PERLA. EOMi. Alert. Oriented x3 . Psych: Normal affect, dress and demeanor. Normal speech. Normal thought content and judgment..     Assessment & Plan:  Erika Gardner is 43 y.o. female present for follow-up Generalized anxiety disorder/insomnia - Stable well controlled anxiety and insomnia - Continue lexapro 10 mg QD and trazadone 25-50 QHS; refills provided today - f/u 6 mos  Gastroesophageal reflux disease without esophagitis Stable.  Refills on Prilosec 20 mg qd. - zantac Dcd d/t recall. Try prilosec PRN. - dexilant provided by GI w/ flares.   F/u 6 months (CPE) Electronically Signed by: Felix Pacini, DO [facility]- [place]

## 2018-12-03 NOTE — Patient Instructions (Signed)
I have refilled your meds.   I am glad you are doing well.   See you in 6 months for your physical (if do at that time)   Please help [place] help you:  We are honored you have chosen [facility] for your Primary Care home. Below you will find basic instructions that you may need to access in the future. Please help [place] help you by reading the instructions, which cover many of the frequent questions we experience.   Prescription refills and request:  -In order to allow more efficient response time, please call your pharmacy for all refills. They will forward the request electronically to [place]. This allows for the quickest possible response. Request left on a nurse line can take longer to refill, since these are checked as time allows between office patients and other phone calls.  - refill request can take up to 3-5 working days to complete.  - If request is sent electronically and request is appropiate, it is usually completed in 1-2 business days.  - all patients will need to be seen routinely for all chronic medical conditions requiring prescription medications (see follow-up below). If you are overdue for follow up on your condition, you will be asked to make an appointment and we will call in enough medication to cover you until your appointment (up to 30 days).  - all controlled substances will require a face to face visit to request/refill.  - if you desire your prescriptions to go through a new pharmacy, and have an active script at original pharmacy, you will need to call your pharmacy and have scripts transferred to new pharmacy. This is completed between the pharmacy locations and not by your provider.    Results: If any images or labs were ordered, it can take up to 1 week to get results depending on the test ordered and the lab/facility running and resulting the test. - Normal or stable results, which do not need further discussion, may be released to your mychart immediately with  attached note to you. A call may not be generated for normal results. Please make certain to sign up for mychart. If you have questions on how to activate your mychart you can call the front office.  - If your results need further discussion, our office will attempt to contact you via phone, and if unable to reach you after 2 attempts, we will release your abnormal result to your mychart with instructions.  - All results will be automatically released in mychart after 1 week.  - Your provider will provide you with explanation and instruction on all relevant material in your results. Please keep in mind, results and labs may appear confusing or abnormal to the untrained eye, but it does not mean they are actually abnormal for you personally. If you have any questions about your results that are not covered, or you desire more detailed explanation than what was provided, you should make an appointment with your provider to do so.   Our office handles many outgoing and incoming calls daily. If we have not contacted you within 1 week about your results, please check your mychart to see if there is a message first and if not, then contact our office.  In helping with this matter, you help decrease call volume, and therefore allow [place] to be able to respond to patients needs more efficiently.   Acute office visits (sick visit):  An acute visit is intended for a new problem and  are scheduled in shorter time slots to allow schedule openings for patients with new problems. This is the appropriate visit to discuss a new problem. Problems will not be addressed by phone call or Echart message. Appointment is needed if requesting treatment. In order to provide you with excellent quality medical care with proper time for you to explain your problem, have an exam and receive treatment with instructions, these appointments should be limited to one new problem per visit. If you experience a new problem, in which you desire to  be addressed, please make an acute office visit, we save openings on the schedule to accommodate you. Please do not save your new problem for any other type of visit, let us take care of it properly and quickly for you.   Follow up visits:  Depending on your condition(s) your provider will need to see you routinely in order to provide you with quality care and prescribe medication(s). Most chronic conditions (Example: hypertension, Diabetes, depression/anxiety... etc), require visits a couple times a year. Your provider will instruct you on proper follow up for your personal medical conditions and history. Please make certain to make follow up appointments for your condition as instructed. Failing to do so could result in lapse in your medication treatment/refills. If you request a refill, and are overdue to be seen on a condition, we will always provide you with a 30 day script (once) to allow you time to schedule.    Medicare wellness (well visit): - we have a wonderful Nurse Selena Batten), that will meet with you and provide you will yearly medicare wellness visits. These visits should occur yearly (can not be scheduled less than 1 calendar year apart) and cover preventive health, immunizations, advance directives and screenings you are entitled to yearly through your medicare benefits. Do not miss out on your entitled benefits, this is when medicare will pay for these benefits to be ordered for you.  These are strongly encouraged by your provider and is the appropriate type of visit to make certain you are up to date with all preventive health benefits. If you have not had your medicare wellness exam in the last 12 months, please make certain to schedule one by calling the office and schedule your medicare wellness with Selena Batten as soon as possible.   Yearly physical (well visit):  - Adults are recommended to be seen yearly for physicals. Check with your insurance and date of your last physical, most insurances  require one calendar year between physicals. Physicals include all preventive health topics, screenings, medical exam and labs that are appropriate for gender/age and history. You may have fasting labs needed at this visit. This is a well visit (not a sick visit), new problems should not be covered during this visit (see acute visit).  - Pediatric patients are seen more frequently when they are younger. Your provider will advise you on well child visit timing that is appropriate for your their age. - This is not a medicare wellness visit. Medicare wellness exams do not have an exam portion to the visit. Some medicare companies allow for a physical, some do not allow a yearly physical. If your medicare allows a yearly physical you can schedule the medicare wellness with our nurse Selena Batten and have your physical with your provider after, on the same day. Please check with insurance for your full benefits.   Late Policy/No Shows:  - all new patients should arrive 15-30 minutes earlier than appointment to allow [place] time  to  obtain all personal demographics,  insurance information and for you to complete office paperwork. - All established patients should arrive 10-15 minutes earlier than appointment time to update all information and be checked in .  - In our best efforts to run on time, if you are late for your appointment you will be asked to either reschedule or if able, we will work you back into the schedule. There will be a wait time to work you back in the schedule,  depending on availability.  - If you are unable to make it to your appointment as scheduled, please call 24 hours ahead of time to allow [place] to fill the time slot with someone else who needs to be seen. If you do not cancel your appointment ahead of time, you may be charged a no show fee.

## 2018-12-17 DIAGNOSIS — Z6826 Body mass index (BMI) 26.0-26.9, adult: Secondary | ICD-10-CM | POA: Diagnosis not present

## 2018-12-17 DIAGNOSIS — Z01411 Encounter for gynecological examination (general) (routine) with abnormal findings: Secondary | ICD-10-CM | POA: Diagnosis not present

## 2018-12-17 DIAGNOSIS — R300 Dysuria: Secondary | ICD-10-CM | POA: Diagnosis not present

## 2018-12-17 DIAGNOSIS — R3915 Urgency of urination: Secondary | ICD-10-CM | POA: Diagnosis not present

## 2018-12-24 ENCOUNTER — Ambulatory Visit: Payer: Self-pay

## 2018-12-24 NOTE — Telephone Encounter (Signed)
Patient called, left VM to return call back to the office.   Message from Lorayne Bender sent at 12/24/2018 4:47 PM EDT   Summary: tightness in chest   Pt called and left voicemail on [facility] mailbox. States that over the past 24 hours she has experienced chest heaviness and tightness in chest. Pt would like a call back at [phone].

## 2018-12-25 NOTE — Telephone Encounter (Signed)
Pt was called and stated that she has some nasal sinus drainage and it has started to move to chest with slight cough. She denies chest pain and just needed to know how often she could use her inhaler. Pt was given instructions and when to call back for virtual visit and when to go to ED. Pt verbalized understanding

## 2018-12-25 NOTE — Telephone Encounter (Signed)
Message from Lorayne Bender sent at 12/24/2018 4:47 PM EDT   Summary: tightness in chest   Pt called and left voicemail on [facility] mailbox. States that over the past 24 hours she has experienced chest heaviness and tightness in chest. Pt would like a call back at [phone].         Attempted to contact pt regarding symptoms; left messagefor pt to call the office  on voicemail [phone]; pt normally seen by Dr Claiborne Billings, [facility]; will route to office for notification.

## 2019-02-17 DIAGNOSIS — H16223 Keratoconjunctivitis sicca, not specified as Sjogren's, bilateral: Secondary | ICD-10-CM | POA: Diagnosis not present

## 2019-05-13 DIAGNOSIS — M2241 Chondromalacia patellae, right knee: Secondary | ICD-10-CM | POA: Diagnosis not present

## 2019-05-14 DIAGNOSIS — E538 Deficiency of other specified B group vitamins: Secondary | ICD-10-CM | POA: Diagnosis not present

## 2019-05-14 DIAGNOSIS — J4520 Mild intermittent asthma, uncomplicated: Secondary | ICD-10-CM | POA: Diagnosis not present

## 2019-05-14 DIAGNOSIS — Z0000 Encounter for general adult medical examination without abnormal findings: Secondary | ICD-10-CM | POA: Diagnosis not present

## 2019-05-14 DIAGNOSIS — E079 Disorder of thyroid, unspecified: Secondary | ICD-10-CM | POA: Diagnosis not present

## 2019-05-14 DIAGNOSIS — E559 Vitamin D deficiency, unspecified: Secondary | ICD-10-CM | POA: Diagnosis not present

## 2019-05-17 DIAGNOSIS — M9903 Segmental and somatic dysfunction of lumbar region: Secondary | ICD-10-CM | POA: Diagnosis not present

## 2019-05-17 DIAGNOSIS — M9905 Segmental and somatic dysfunction of pelvic region: Secondary | ICD-10-CM | POA: Diagnosis not present

## 2019-05-17 DIAGNOSIS — M5137 Other intervertebral disc degeneration, lumbosacral region: Secondary | ICD-10-CM | POA: Diagnosis not present

## 2019-05-17 DIAGNOSIS — M7060 Trochanteric bursitis, unspecified hip: Secondary | ICD-10-CM | POA: Diagnosis not present

## 2019-05-18 DIAGNOSIS — Z7409 Other reduced mobility: Secondary | ICD-10-CM | POA: Diagnosis not present

## 2019-05-18 DIAGNOSIS — M25661 Stiffness of right knee, not elsewhere classified: Secondary | ICD-10-CM | POA: Diagnosis not present

## 2019-05-18 DIAGNOSIS — M2241 Chondromalacia patellae, right knee: Secondary | ICD-10-CM | POA: Diagnosis not present

## 2019-05-18 DIAGNOSIS — R29898 Other symptoms and signs involving the musculoskeletal system: Secondary | ICD-10-CM | POA: Diagnosis not present

## 2019-05-20 DIAGNOSIS — R29898 Other symptoms and signs involving the musculoskeletal system: Secondary | ICD-10-CM | POA: Diagnosis not present

## 2019-05-20 DIAGNOSIS — M2241 Chondromalacia patellae, right knee: Secondary | ICD-10-CM | POA: Diagnosis not present

## 2019-05-20 DIAGNOSIS — Z7409 Other reduced mobility: Secondary | ICD-10-CM | POA: Diagnosis not present

## 2019-05-20 DIAGNOSIS — M25661 Stiffness of right knee, not elsewhere classified: Secondary | ICD-10-CM | POA: Diagnosis not present

## 2019-05-28 DIAGNOSIS — M7060 Trochanteric bursitis, unspecified hip: Secondary | ICD-10-CM | POA: Diagnosis not present

## 2019-05-28 DIAGNOSIS — M79605 Pain in left leg: Secondary | ICD-10-CM | POA: Diagnosis not present

## 2019-05-28 DIAGNOSIS — Z7951 Long term (current) use of inhaled steroids: Secondary | ICD-10-CM | POA: Diagnosis not present

## 2019-05-28 DIAGNOSIS — J45909 Unspecified asthma, uncomplicated: Secondary | ICD-10-CM | POA: Diagnosis not present

## 2019-05-28 DIAGNOSIS — M9903 Segmental and somatic dysfunction of lumbar region: Secondary | ICD-10-CM | POA: Diagnosis not present

## 2019-05-28 DIAGNOSIS — G8911 Acute pain due to trauma: Secondary | ICD-10-CM | POA: Diagnosis not present

## 2019-05-28 DIAGNOSIS — M79604 Pain in right leg: Secondary | ICD-10-CM | POA: Diagnosis not present

## 2019-05-28 DIAGNOSIS — Z888 Allergy status to other drugs, medicaments and biological substances status: Secondary | ICD-10-CM | POA: Diagnosis not present

## 2019-05-28 DIAGNOSIS — M546 Pain in thoracic spine: Secondary | ICD-10-CM | POA: Diagnosis not present

## 2019-05-28 DIAGNOSIS — M9905 Segmental and somatic dysfunction of pelvic region: Secondary | ICD-10-CM | POA: Diagnosis not present

## 2019-05-28 DIAGNOSIS — Z79899 Other long term (current) drug therapy: Secondary | ICD-10-CM | POA: Diagnosis not present

## 2019-05-28 DIAGNOSIS — Z043 Encounter for examination and observation following other accident: Secondary | ICD-10-CM | POA: Diagnosis not present

## 2019-05-28 DIAGNOSIS — M545 Low back pain: Secondary | ICD-10-CM | POA: Diagnosis not present

## 2019-05-28 DIAGNOSIS — Z87891 Personal history of nicotine dependence: Secondary | ICD-10-CM | POA: Diagnosis not present

## 2019-05-28 DIAGNOSIS — M5137 Other intervertebral disc degeneration, lumbosacral region: Secondary | ICD-10-CM | POA: Diagnosis not present

## 2019-05-28 DIAGNOSIS — K219 Gastro-esophageal reflux disease without esophagitis: Secondary | ICD-10-CM | POA: Diagnosis not present

## 2019-06-01 DIAGNOSIS — G8929 Other chronic pain: Secondary | ICD-10-CM | POA: Diagnosis not present

## 2019-06-01 DIAGNOSIS — M5441 Lumbago with sciatica, right side: Secondary | ICD-10-CM | POA: Diagnosis not present

## 2019-06-01 DIAGNOSIS — M5117 Intervertebral disc disorders with radiculopathy, lumbosacral region: Secondary | ICD-10-CM | POA: Diagnosis not present

## 2019-06-01 DIAGNOSIS — M545 Low back pain: Secondary | ICD-10-CM | POA: Diagnosis not present

## 2019-06-01 DIAGNOSIS — Z888 Allergy status to other drugs, medicaments and biological substances status: Secondary | ICD-10-CM | POA: Diagnosis not present

## 2019-06-01 DIAGNOSIS — M5442 Lumbago with sciatica, left side: Secondary | ICD-10-CM | POA: Diagnosis not present

## 2019-06-01 DIAGNOSIS — M5137 Other intervertebral disc degeneration, lumbosacral region: Secondary | ICD-10-CM | POA: Diagnosis not present

## 2019-06-01 DIAGNOSIS — M5136 Other intervertebral disc degeneration, lumbar region: Secondary | ICD-10-CM | POA: Diagnosis not present

## 2019-06-01 DIAGNOSIS — Z885 Allergy status to narcotic agent status: Secondary | ICD-10-CM | POA: Diagnosis not present

## 2019-06-01 DIAGNOSIS — M47817 Spondylosis without myelopathy or radiculopathy, lumbosacral region: Secondary | ICD-10-CM | POA: Diagnosis not present

## 2019-06-01 DIAGNOSIS — Z9889 Other specified postprocedural states: Secondary | ICD-10-CM | POA: Diagnosis not present

## 2019-06-11 DIAGNOSIS — M5117 Intervertebral disc disorders with radiculopathy, lumbosacral region: Secondary | ICD-10-CM | POA: Diagnosis not present

## 2019-06-11 DIAGNOSIS — M4726 Other spondylosis with radiculopathy, lumbar region: Secondary | ICD-10-CM | POA: Diagnosis not present

## 2019-06-11 DIAGNOSIS — G8929 Other chronic pain: Secondary | ICD-10-CM | POA: Diagnosis not present

## 2019-06-17 DIAGNOSIS — Z1159 Encounter for screening for other viral diseases: Secondary | ICD-10-CM | POA: Diagnosis not present

## 2019-06-17 DIAGNOSIS — G8929 Other chronic pain: Secondary | ICD-10-CM | POA: Diagnosis not present

## 2019-06-17 DIAGNOSIS — Z87891 Personal history of nicotine dependence: Secondary | ICD-10-CM | POA: Diagnosis not present

## 2019-06-17 DIAGNOSIS — Z8249 Family history of ischemic heart disease and other diseases of the circulatory system: Secondary | ICD-10-CM | POA: Diagnosis not present

## 2019-06-17 DIAGNOSIS — M5441 Lumbago with sciatica, right side: Secondary | ICD-10-CM | POA: Diagnosis not present

## 2019-06-17 DIAGNOSIS — E079 Disorder of thyroid, unspecified: Secondary | ICD-10-CM | POA: Diagnosis not present

## 2019-06-17 DIAGNOSIS — E042 Nontoxic multinodular goiter: Secondary | ICD-10-CM | POA: Diagnosis not present

## 2019-06-17 DIAGNOSIS — E538 Deficiency of other specified B group vitamins: Secondary | ICD-10-CM | POA: Diagnosis not present

## 2019-06-17 DIAGNOSIS — Z8349 Family history of other endocrine, nutritional and metabolic diseases: Secondary | ICD-10-CM | POA: Diagnosis not present

## 2019-06-17 DIAGNOSIS — R7989 Other specified abnormal findings of blood chemistry: Secondary | ICD-10-CM | POA: Diagnosis not present

## 2019-06-17 DIAGNOSIS — E559 Vitamin D deficiency, unspecified: Secondary | ICD-10-CM | POA: Diagnosis not present

## 2019-06-17 DIAGNOSIS — M5442 Lumbago with sciatica, left side: Secondary | ICD-10-CM | POA: Diagnosis not present

## 2019-06-17 DIAGNOSIS — R29898 Other symptoms and signs involving the musculoskeletal system: Secondary | ICD-10-CM | POA: Diagnosis not present

## 2019-06-17 DIAGNOSIS — Z79899 Other long term (current) drug therapy: Secondary | ICD-10-CM | POA: Diagnosis not present

## 2019-06-25 DIAGNOSIS — M9902 Segmental and somatic dysfunction of thoracic region: Secondary | ICD-10-CM | POA: Diagnosis not present

## 2019-06-25 DIAGNOSIS — M9904 Segmental and somatic dysfunction of sacral region: Secondary | ICD-10-CM | POA: Diagnosis not present

## 2019-06-25 DIAGNOSIS — M9903 Segmental and somatic dysfunction of lumbar region: Secondary | ICD-10-CM | POA: Diagnosis not present

## 2019-06-25 DIAGNOSIS — M5441 Lumbago with sciatica, right side: Secondary | ICD-10-CM | POA: Diagnosis not present

## 2019-07-02 DIAGNOSIS — M5441 Lumbago with sciatica, right side: Secondary | ICD-10-CM | POA: Diagnosis not present

## 2019-07-02 DIAGNOSIS — Z9889 Other specified postprocedural states: Secondary | ICD-10-CM | POA: Diagnosis not present

## 2019-07-02 DIAGNOSIS — M5136 Other intervertebral disc degeneration, lumbar region: Secondary | ICD-10-CM | POA: Diagnosis not present

## 2019-07-02 DIAGNOSIS — G8929 Other chronic pain: Secondary | ICD-10-CM | POA: Diagnosis not present

## 2019-07-03 DIAGNOSIS — J0140 Acute pansinusitis, unspecified: Secondary | ICD-10-CM | POA: Diagnosis not present

## 2019-07-05 DIAGNOSIS — Z20828 Contact with and (suspected) exposure to other viral communicable diseases: Secondary | ICD-10-CM | POA: Diagnosis not present

## 2019-07-14 DIAGNOSIS — M9903 Segmental and somatic dysfunction of lumbar region: Secondary | ICD-10-CM | POA: Diagnosis not present

## 2019-07-14 DIAGNOSIS — M5441 Lumbago with sciatica, right side: Secondary | ICD-10-CM | POA: Diagnosis not present

## 2019-07-14 DIAGNOSIS — M9902 Segmental and somatic dysfunction of thoracic region: Secondary | ICD-10-CM | POA: Diagnosis not present

## 2019-07-14 DIAGNOSIS — M9904 Segmental and somatic dysfunction of sacral region: Secondary | ICD-10-CM | POA: Diagnosis not present

## 2019-07-23 DIAGNOSIS — Z20828 Contact with and (suspected) exposure to other viral communicable diseases: Secondary | ICD-10-CM | POA: Diagnosis not present

## 2019-08-04 DIAGNOSIS — G8929 Other chronic pain: Secondary | ICD-10-CM | POA: Diagnosis not present

## 2019-08-04 DIAGNOSIS — M5417 Radiculopathy, lumbosacral region: Secondary | ICD-10-CM | POA: Diagnosis not present

## 2019-08-04 DIAGNOSIS — M5441 Lumbago with sciatica, right side: Secondary | ICD-10-CM | POA: Diagnosis not present

## 2019-08-04 DIAGNOSIS — M5136 Other intervertebral disc degeneration, lumbar region: Secondary | ICD-10-CM | POA: Diagnosis not present

## 2019-08-16 DIAGNOSIS — M5417 Radiculopathy, lumbosacral region: Secondary | ICD-10-CM | POA: Diagnosis not present

## 2019-09-01 DIAGNOSIS — M5441 Lumbago with sciatica, right side: Secondary | ICD-10-CM | POA: Diagnosis not present

## 2019-09-01 DIAGNOSIS — M5136 Other intervertebral disc degeneration, lumbar region: Secondary | ICD-10-CM | POA: Diagnosis not present

## 2019-09-01 DIAGNOSIS — Z9889 Other specified postprocedural states: Secondary | ICD-10-CM | POA: Diagnosis not present

## 2019-09-01 DIAGNOSIS — M549 Dorsalgia, unspecified: Secondary | ICD-10-CM | POA: Diagnosis not present

## 2019-09-08 DIAGNOSIS — M9904 Segmental and somatic dysfunction of sacral region: Secondary | ICD-10-CM | POA: Diagnosis not present

## 2019-09-08 DIAGNOSIS — M5441 Lumbago with sciatica, right side: Secondary | ICD-10-CM | POA: Diagnosis not present

## 2019-09-08 DIAGNOSIS — M9903 Segmental and somatic dysfunction of lumbar region: Secondary | ICD-10-CM | POA: Diagnosis not present

## 2019-09-08 DIAGNOSIS — M9902 Segmental and somatic dysfunction of thoracic region: Secondary | ICD-10-CM | POA: Diagnosis not present

## 2022-05-20 ENCOUNTER — Ambulatory Visit (HOSPITAL_COMMUNITY)
Admission: EM | Admit: 2022-05-20 | Discharge: 2022-05-21 | Disposition: A | Discharge status: Home / Self Care | Payer: PRIVATE HEALTH INSURANCE

## 2022-05-20 DIAGNOSIS — F411 Generalized anxiety disorder: Secondary | ICD-10-CM | POA: Diagnosis not present

## 2022-05-20 DIAGNOSIS — Z9189 Other specified personal risk factors, not elsewhere classified: Secondary | ICD-10-CM | POA: Diagnosis not present

## 2022-05-20 DIAGNOSIS — T7401XA Adult neglect or abandonment, confirmed, initial encounter: Secondary | ICD-10-CM

## 2022-05-20 NOTE — ED Triage Notes (Signed)
Pt presents to [facility] voluntarily, accompanied by her daughter with complaints of feeling depressed, overwhelmed, lack of appetite/sleep. Pt recently had a bad breakup about 2 weeks ago and has had difficulty managing her emotions. Pt reports hx of anxiety and is currently prescribed Lexapro and Trazadone and is compliant with medications. Pt currently denies SI, HI, AVH.
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
## 2022-05-20 NOTE — Discharge Instructions (Addendum)
Pt to f/u with Walk-in psychiatry  Pt to f/u with PCP about medication change
